# Patient Record
Sex: Female | Born: 1970 | Race: Black or African American | Hispanic: No | Smoking: Never smoker
Health system: Southern US, Community
[De-identification: ages and names within clinical notes are randomized; demographics above are authoritative.]

## PROBLEM LIST (undated history)

## (undated) DIAGNOSIS — K219 Gastro-esophageal reflux disease without esophagitis: Secondary | ICD-10-CM

## (undated) DIAGNOSIS — I10 Essential (primary) hypertension: Secondary | ICD-10-CM

## (undated) HISTORY — PX: ABDOMINAL SURGERY: SHX537

## (undated) HISTORY — PX: ABDOMINAL HYSTERECTOMY: SHX81

## (undated) HISTORY — PX: LAPAROSCOPIC GASTRIC SLEEVE RESECTION: SHX5895

## (undated) HISTORY — PX: FOOT SURGERY: SHX648

## (undated) HISTORY — PX: OTHER SURGICAL HISTORY: SHX169

## (undated) HISTORY — PX: CHOLECYSTECTOMY: SHX55

---

## 2010-07-07 DIAGNOSIS — I10 Essential (primary) hypertension: Secondary | ICD-10-CM | POA: Insufficient documentation

## 2011-02-26 DIAGNOSIS — F988 Other specified behavioral and emotional disorders with onset usually occurring in childhood and adolescence: Secondary | ICD-10-CM | POA: Insufficient documentation

## 2012-03-09 DIAGNOSIS — E6609 Other obesity due to excess calories: Secondary | ICD-10-CM | POA: Insufficient documentation

## 2012-09-27 DIAGNOSIS — N812 Incomplete uterovaginal prolapse: Secondary | ICD-10-CM | POA: Insufficient documentation

## 2012-12-27 DIAGNOSIS — N3946 Mixed incontinence: Secondary | ICD-10-CM | POA: Insufficient documentation

## 2015-03-26 DIAGNOSIS — Z9884 Bariatric surgery status: Secondary | ICD-10-CM | POA: Insufficient documentation

## 2015-04-07 DIAGNOSIS — E559 Vitamin D deficiency, unspecified: Secondary | ICD-10-CM | POA: Insufficient documentation

## 2015-07-08 DIAGNOSIS — M25561 Pain in right knee: Secondary | ICD-10-CM | POA: Insufficient documentation

## 2015-07-08 DIAGNOSIS — M7671 Peroneal tendinitis, right leg: Secondary | ICD-10-CM | POA: Insufficient documentation

## 2015-07-08 DIAGNOSIS — M2141 Flat foot [pes planus] (acquired), right foot: Secondary | ICD-10-CM | POA: Insufficient documentation

## 2015-08-11 DIAGNOSIS — Z0000 Encounter for general adult medical examination without abnormal findings: Secondary | ICD-10-CM | POA: Insufficient documentation

## 2016-02-06 DIAGNOSIS — M7581 Other shoulder lesions, right shoulder: Secondary | ICD-10-CM | POA: Insufficient documentation

## 2016-03-09 ENCOUNTER — Emergency Department: Payer: BLUE CROSS/BLUE SHIELD

## 2016-03-09 ENCOUNTER — Emergency Department
Admission: EM | Admit: 2016-03-09 | Discharge: 2016-03-09 | Disposition: A | Discharge status: Home / Self Care | Payer: BLUE CROSS/BLUE SHIELD | Attending: Emergency Medicine | Admitting: Emergency Medicine

## 2016-03-09 ENCOUNTER — Encounter: Payer: Self-pay | Admitting: Emergency Medicine

## 2016-03-09 DIAGNOSIS — S46001D Unspecified injury of muscle(s) and tendon(s) of the rotator cuff of right shoulder, subsequent encounter: Secondary | ICD-10-CM | POA: Insufficient documentation

## 2016-03-09 DIAGNOSIS — X58XXXD Exposure to other specified factors, subsequent encounter: Secondary | ICD-10-CM | POA: Diagnosis not present

## 2016-03-09 DIAGNOSIS — S46001A Unspecified injury of muscle(s) and tendon(s) of the rotator cuff of right shoulder, initial encounter: Secondary | ICD-10-CM

## 2016-03-09 DIAGNOSIS — M25531 Pain in right wrist: Secondary | ICD-10-CM | POA: Diagnosis present

## 2016-03-09 MED ORDER — LIDOCAINE 5 % EX PTCH: 1.0000 | MEDICATED_PATCH | CUTANEOUS | 0 refills | Status: DC

## 2016-03-09 MED ORDER — LIDOCAINE 5 % EX PTCH
1.0000 | MEDICATED_PATCH | CUTANEOUS | Status: DC
  Administered 2016-03-09: 1 via TRANSDERMAL
  Filled 2016-03-09: qty 1

## 2016-03-09 NOTE — ED Triage Notes (Signed)
Pt states right shoulder and right wrist pain for over a mth.

## 2016-03-09 NOTE — ED Provider Notes (Signed)
[facility] Emergency Department Provider Note  ____________________________________________  Time seen: Approximately 3:26 PM  I have reviewed the triage vital signs and the nursing notes.   HISTORY  Chief Complaint Wrist Pain and Shoulder Pain    HPI Marie Contreras is a 46 y.o. female that presents with right shoulder pain and right wrist pain for greater than 1 month. Patient states that she has difficulty moving arm in any direction. Patient has seen primary care provider for this and was told that she has a rotator cuff injury. Patient was given exercises for injury but exercises were not helping.She states that she has pain in her wrist at night. Patient states that her wrist feels weak. Patient states she only has the symptoms at night. No numbness or tingling.   History reviewed. No pertinent past medical history.  There are no active problems to display for this patient.   Past Surgical History:  Procedure Laterality Date  . ABDOMINAL SURGERY      Prior to Admission medications   Medication Sig Start Date End Date Taking? Authorizing Provider  lidocaine (LIDODERM) 5 % Place 1 patch onto the skin daily. Remove & Discard patch within 12 hours or as directed by MD 03/09/16 03/09/17  Enid DerryAshley Efrat Zuidema, PA-C    Allergies Ibuprofen and Tylenol [acetaminophen]  No family history on file.  Social History Social History  Substance Use Topics  . Smoking status: Never Smoker  . Smokeless tobacco: Never Used  . Alcohol use Yes     Review of Systems  Constitutional: No fever/chills ENT: No upper respiratory complaints. Cardiovascular: No chest pain. Respiratory: No cough. No SOB. Gastrointestinal: No abdominal pain.  No nausea, no vomiting. Skin: Negative for rash, abrasions, lacerations, ecchymosis. Neurological: Negative for headaches, numbness or tingling   ____________________________________________   PHYSICAL EXAM:  VITAL SIGNS: ED  Triage Vitals  Enc Vitals Group     BP 03/09/16 1140 (!) 167/75     Pulse Rate 03/09/16 1140 (!) 51     Resp 03/09/16 1140 18     Temp 03/09/16 1140 98.2 F (36.8 C)     Temp Source 03/09/16 1140 Oral     SpO2 03/09/16 1140 100 %     Weight 03/09/16 1140 186 lb (84.4 kg)     Height 03/09/16 1140 5\' 4"  (1.626 m)     Head Circumference --      Peak Flow --      Pain Score 03/09/16 1141 8     Pain Loc --      Pain Edu? --      Excl. in GC? --      Constitutional: Alert and oriented. Well appearing and in no acute distress. Eyes: Conjunctivae are normal. PERRL. EOMI. Head: Atraumatic. ENT:      Ears:      Nose: No congestion/rhinnorhea.      Mouth/Throat: Mucous membranes are moist.  Neck: No stridor.  No cervical spine tenderness to palpation. Hematological/Lymphatic/Immunilogical: No cervical lymphadenopathy. Cardiovascular: Normal rate, regular rhythm. Good peripheral circulation. 2+ radial pulses. Respiratory: Normal respiratory effort without tachypnea or retractions. Lungs CTAB. Good air entry to the bases with no decreased or absent breath sounds. Musculoskeletal: Pain with abduction and extension of shoulder. Full passive range of motion of shoulder. No tenderness to palpation over wrist or shoulder. Full range of motion of wrist. Strength 5 out of 5 in elbow and fingers. Decreased strength in shoulder. Shoulder exam limited due to pain. Negative Tinel's, Phalens.  Neurologic:  Normal speech and language. No gross focal neurologic deficits are appreciated.  Sensation intact. Skin:  Skin is warm, dry and intact. No rash noted. Psychiatric: Mood and affect are normal. Speech and behavior are normal. Patient exhibits appropriate insight and judgement.   ____________________________________________   LABS (all labs ordered are listed, but only abnormal results are displayed)  Labs Reviewed - No data to  display ____________________________________________  EKG   ____________________________________________  RADIOLOGY Lexine Baton, personally viewed and evaluated these images (plain radiographs) as part of my medical decision making, as well as reviewing the written report by the radiologist.  Dg Shoulder Right  Result Date: 03/09/2016 CLINICAL DATA:  Right shoulder and wrist pain for greater than 1 month. No known injury. EXAM: RIGHT SHOULDER - 2+ VIEW COMPARISON:  None. FINDINGS: Ossicles along the lateral acromion which are perpendicular to the normal acromion physes. No subacromial narrowing. No fracture deformity, rotator cuff calcification, or degenerative joint narrowing. IMPRESSION: No acute finding or degenerative narrowing. Electronically Signed   By: Marnee Spring M.D.   On: 03/09/2016 13:19   Dg Wrist Complete Right  Result Date: 03/09/2016 CLINICAL DATA:  46 year old female with a history of right shoulder and wrist pain for 1 month EXAM: RIGHT WRIST - COMPLETE 3+ VIEW COMPARISON:  None. FINDINGS: No acute fracture identified. No focal soft tissue swelling. No radiopaque foreign body. Carpal bones maintain alignment.  Unremarkable scaphoid view. IMPRESSION: No acute bony abnormality. Signed, Yvone Neu. Loreta Ave, DO [facility] Electronically Signed   By: Gilmer Mor D.O.   On: 03/09/2016 13:14    ____________________________________________    PROCEDURES  Procedure(s) performed:    Procedures    Medications  lidocaine (LIDODERM) 5 % 1 patch (1 patch Transdermal Patch Applied 03/09/16 1233)     ____________________________________________   INITIAL IMPRESSION / ASSESSMENT AND PLAN / ED COURSE  Pertinent labs & imaging results that were available during my care of the patient were reviewed by me and considered in my medical decision making (see chart for details).  Review of the [place] CSRS was performed  in accordance of the [facility] prior to dispensing any controlled drugs.     Patient's diagnosis is consistent with Rotator cuff injury. Vital signs and exam are reassuring. X-ray negative for any acute bony abnormalities seen on wrist or elbow. Patient will be discharged home with prescriptions for lidocaine patch. Patient is to follow up with ortho as directed. Patient is given ED precautions to return to the ED for any worsening or new symptoms.     ____________________________________________  FINAL CLINICAL IMPRESSION(S) / ED DIAGNOSES  Final diagnoses:  Injury of right rotator cuff, initial encounter      NEW MEDICATIONS STARTED DURING THIS VISIT:  Discharge Medication List as of 03/09/2016  1:40 PM    START taking these medications   Details  lidocaine (LIDODERM) 5 % Place 1 patch onto the skin daily. Remove & Discard patch within 12 hours or as directed by MD, Starting Tue 03/09/2016, Until Wed 03/09/2017, Print            This chart was dictated using voice recognition software/Dragon. Despite best efforts to proofread, errors can occur which can change the meaning. Any change was purely unintentional.    Enid Derry, PA-C 03/09/16 1545    Phineas Semen, MD 03/10/16 1406

## 2016-03-09 NOTE — ED Notes (Signed)
Pt presents with right shoulder pain x 1 month. She went to PCP and was given exercises to do but it is getting worse instead of better. Pt alert & oriented. NAD noted.

## 2017-01-29 ENCOUNTER — Emergency Department: Payer: BLUE CROSS/BLUE SHIELD

## 2017-01-29 ENCOUNTER — Emergency Department
Admission: EM | Admit: 2017-01-29 | Discharge: 2017-01-29 | Disposition: A | Discharge status: Home / Self Care | Payer: BLUE CROSS/BLUE SHIELD | Attending: Emergency Medicine | Admitting: Emergency Medicine

## 2017-01-29 ENCOUNTER — Encounter: Payer: Self-pay | Admitting: Emergency Medicine

## 2017-01-29 DIAGNOSIS — Z79899 Other long term (current) drug therapy: Secondary | ICD-10-CM | POA: Insufficient documentation

## 2017-01-29 DIAGNOSIS — Y939 Activity, unspecified: Secondary | ICD-10-CM | POA: Diagnosis not present

## 2017-01-29 DIAGNOSIS — M7918 Myalgia, other site: Secondary | ICD-10-CM | POA: Diagnosis not present

## 2017-01-29 DIAGNOSIS — Y999 Unspecified external cause status: Secondary | ICD-10-CM | POA: Insufficient documentation

## 2017-01-29 MED ORDER — IBUPROFEN 600 MG PO TABS: 600.0000 mg | ORAL_TABLET | Freq: Once | ORAL | Status: DC

## 2017-01-29 MED ORDER — HYDROCODONE-ACETAMINOPHEN 5-325 MG PO TABS
1.0000 | ORAL_TABLET | Freq: Once | ORAL | Status: AC
  Administered 2017-01-29: 1 via ORAL
  Filled 2017-01-29: qty 1

## 2017-01-29 MED ORDER — IBUPROFEN 600 MG PO TABS: 600.0000 mg | ORAL_TABLET | Freq: Three times a day (TID) | ORAL | 0 refills | Status: DC | PRN
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
## 2017-01-29 NOTE — ED Notes (Signed)
Reviewed discharge instructions and follow-up care with patient. Patient verbalized understanding of all information reviewed. Patient stable, with no distress noted at this time.    Patient refused ibuprofen prescription due to hx of gastric bypass surgery.  MD informed. MD reported patient could take OTC tylenol instead. Patient informed. Patient verbalized understanding.
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
## 2017-01-29 NOTE — ED Notes (Signed)
Patient returned from CT

## 2017-02-09 ENCOUNTER — Ambulatory Visit: Payer: BLUE CROSS/BLUE SHIELD | Admitting: Family Medicine

## 2017-02-22 ENCOUNTER — Ambulatory Visit: Payer: BLUE CROSS/BLUE SHIELD | Admitting: Family Medicine

## 2017-03-09 ENCOUNTER — Encounter: Payer: Self-pay | Admitting: Family Medicine

## 2017-03-09 ENCOUNTER — Ambulatory Visit: Payer: BLUE CROSS/BLUE SHIELD | Admitting: Family Medicine

## 2017-03-09 VITALS — BP 130/80 | HR 59 | Ht 64.0 in | Wt 210.5 lb

## 2017-03-09 DIAGNOSIS — I10 Essential (primary) hypertension: Secondary | ICD-10-CM | POA: Diagnosis not present
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
## 2017-04-11 ENCOUNTER — Ambulatory Visit: Payer: BLUE CROSS/BLUE SHIELD | Admitting: Family Medicine

## 2017-04-11 DIAGNOSIS — Z0289 Encounter for other administrative examinations: Secondary | ICD-10-CM

## 2017-07-28 ENCOUNTER — Encounter: Payer: Self-pay | Admitting: Family Medicine

## 2017-07-28 ENCOUNTER — Ambulatory Visit (INDEPENDENT_AMBULATORY_CARE_PROVIDER_SITE_OTHER): Payer: BLUE CROSS/BLUE SHIELD | Admitting: Family Medicine

## 2017-07-28 VITALS — BP 138/80 | HR 53 | Temp 97.9°F | Ht 64.0 in | Wt 213.0 lb

## 2017-07-28 DIAGNOSIS — R001 Bradycardia, unspecified: Secondary | ICD-10-CM | POA: Insufficient documentation

## 2017-07-28 DIAGNOSIS — Z01818 Encounter for other preprocedural examination: Secondary | ICD-10-CM
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
## 2017-07-28 NOTE — Addendum Note (Signed)
Addended by: Varney BilesWIESNER, Kathy Wahid M on: 07/28/2017 02:52 PM   Modules accepted: Orders

## 2017-08-02 ENCOUNTER — Other Ambulatory Visit (INDEPENDENT_AMBULATORY_CARE_PROVIDER_SITE_OTHER): Payer: BLUE CROSS/BLUE SHIELD

## 2017-08-02 DIAGNOSIS — Z01818 Encounter for other preprocedural examination: Secondary | ICD-10-CM | POA: Diagnosis not present

## 2017-08-02 DIAGNOSIS — R001 Bradycardia, unspecified: Secondary | ICD-10-CM | POA: Diagnosis not present

## 2017-08-02 LAB — BASIC METABOLIC PANEL
BUN: 14 mg/dL (ref 6–23)
CALCIUM: 10.1 mg/dL (ref 8.4–10.5)
CO2: 25 meq/L (ref 19–32)
CREATININE: 0.91 mg/dL (ref 0.40–1.20)
Chloride: 102 mEq/L (ref 96–112)
GFR: 85.12 mL/min (ref >60.00)
Glucose, Bld: 96 mg/dL (ref 70–99)
Potassium: 4.2 mEq/L (ref 3.5–5.1)
SODIUM: 137 meq/L (ref 135–145)

## 2017-08-02 LAB — CBC WITH DIFFERENTIAL/PLATELET
Basophils Absolute: 0 10*3/uL (ref 0.0–0.1)
Basophils Relative: 0.3 % (ref 0.0–3.0)
EOS PCT: 1.7 % (ref 0.0–5.0)
Eosinophils Absolute: 0.1 10*3/uL (ref 0.0–0.7)
HCT: 45.1 % (ref 36.0–46.0)
Hemoglobin: 15.2 g/dL — ABNORMAL HIGH (ref 12.0–15.0)
LYMPHS ABS: 2.9 10*3/uL (ref 0.7–4.0)
Lymphocytes Relative: 41.3 % (ref 12.0–46.0)
MCHC: 33.6 g/dL (ref 30.0–36.0)
MCV: 88.2 fl (ref 78.0–100.0)
MONO ABS: 0.3 10*3/uL (ref 0.1–1.0)
MONOS PCT: 4.7 % (ref 3.0–12.0)
NEUTROS ABS: 3.7 10*3/uL (ref 1.4–7.7)
NEUTROS PCT: 52 % (ref 43.0–77.0)
PLATELETS: 228 10*3/uL (ref 150.0–400.0)
RBC: 5.12 Mil/uL — AB (ref 3.87–5.11)
RDW: 13.7 % (ref 11.5–15.5)
WBC: 7.1 10*3/uL (ref 4.0–10.5)

## 2017-08-02 LAB — TSH: TSH: 1.97 u[IU]/mL (ref 0.35–4.50)

## 2017-09-26 ENCOUNTER — Encounter: Payer: BLUE CROSS/BLUE SHIELD | Admitting: Family Medicine

## 2017-12-22 ENCOUNTER — Encounter (HOSPITAL_COMMUNITY): Payer: Self-pay | Admitting: Emergency Medicine

## 2017-12-22 ENCOUNTER — Emergency Department (HOSPITAL_COMMUNITY): Payer: BLUE CROSS/BLUE SHIELD

## 2017-12-22 ENCOUNTER — Emergency Department (HOSPITAL_COMMUNITY)
Admission: EM | Admit: 2017-12-22 | Discharge: 2017-12-22 | Disposition: A | Discharge status: Home / Self Care | Payer: BLUE CROSS/BLUE SHIELD | Attending: Emergency Medicine | Admitting: Emergency Medicine

## 2017-12-22 ENCOUNTER — Other Ambulatory Visit: Payer: Self-pay

## 2017-12-22 DIAGNOSIS — E876 Hypokalemia: Secondary | ICD-10-CM

## 2017-12-22 DIAGNOSIS — Z79899 Other long term (current) drug therapy: Secondary | ICD-10-CM | POA: Diagnosis not present

## 2017-12-22 DIAGNOSIS — R0789 Other chest pain: Secondary | ICD-10-CM | POA: Diagnosis not present

## 2017-12-22 DIAGNOSIS — I10 Essential (primary) hypertension: Secondary | ICD-10-CM | POA: Insufficient documentation

## 2017-12-22 DIAGNOSIS — R079 Chest pain, unspecified: Secondary | ICD-10-CM | POA: Diagnosis present

## 2017-12-22 LAB — CBC
HEMATOCRIT: 44.8 % (ref 36.0–46.0)
HEMOGLOBIN: 14 g/dL (ref 12.0–15.0)
MCH: 26.9 pg (ref 26.0–34.0)
MCHC: 31.3 g/dL (ref 30.0–36.0)
MCV: 86.2 fL (ref 80.0–100.0)
NRBC: 0 % (ref 0.0–0.2)
Platelets: 294 10*3/uL (ref 150–400)
RBC: 5.2 MIL/uL — AB (ref 3.87–5.11)
RDW: 14.5 % (ref 11.5–15.5)
WBC: 6.6 10*3/uL (ref 4.0–10.5)

## 2017-12-22 LAB — BASIC METABOLIC PANEL
ANION GAP: 10 (ref 5–15)
BUN: 15 mg/dL (ref 6–20)
CALCIUM: 9.1 mg/dL (ref 8.9–10.3)
CO2: 26 mmol/L (ref 22–32)
CREATININE: 0.87 mg/dL (ref 0.44–1.00)
Chloride: 104 mmol/L (ref 98–111)
Glucose, Bld: 87 mg/dL (ref 70–99)
Potassium: 3.3 mmol/L — ABNORMAL LOW (ref 3.5–5.1)
SODIUM: 140 mmol/L (ref 135–145)

## 2017-12-22 LAB — I-STAT BETA HCG BLOOD, ED (MC, WL, AP ONLY)

## 2017-12-22 LAB — I-STAT TROPONIN, ED: Troponin i, poc: 0 ng/mL (ref 0.00–0.08)

## 2017-12-22 MED ORDER — POTASSIUM CHLORIDE CRYS ER 20 MEQ PO TBCR
20.0000 meq | EXTENDED_RELEASE_TABLET | Freq: Once | ORAL | Status: AC
  Administered 2017-12-22: 20 meq via ORAL
  Filled 2017-12-22: qty 1

## 2017-12-22 MED ORDER — ACETAMINOPHEN 325 MG PO TABS
650.0000 mg | ORAL_TABLET | Freq: Once | ORAL | Status: AC
  Administered 2017-12-22: 650 mg via ORAL
  Filled 2017-12-22: qty 2

## 2017-12-22 NOTE — ED Triage Notes (Signed)
Patient complaining of mid chest pain and left shoulder. This started yesterday. Patient states the symptoms are getting worse. Patient states her blood pressure has been high.

## 2017-12-22 NOTE — ED Provider Notes (Signed)
[facility]-EMERGENCY DEPT Provider Note   CSN: [id number] Arrival date & time: 12/22/17  0645     History   Chief Complaint Chief Complaint  Patient presents with  . Chest Pain    HPI Marie Contreras is a 47 y.o. female.  The history is provided by the patient.  Chest Pain   This is a new problem. Episode onset: 3 days ago. Episode frequency: intially pain was intermittent but has been constant since yesterday. The problem has been gradually worsening. Associated with: worse with movement, breathing, certain positions. Pain location: central chest. The pain is at a severity of 5/10. The pain is moderate. The quality of the pain is described as heavy and pleuritic. The pain does not radiate (pt states left shoulder has been hurting but this was going on long before chest pain and thinks she has a torn rotator cuff). Duration of episode(s) is 3 days. The symptoms are aggravated by certain positions and deep breathing. Associated symptoms include cough and malaise/fatigue. Pertinent negatives include no abdominal pain, no exertional chest pressure, no fever, no leg pain, no lower extremity edema, no palpitations, no sputum production, no vomiting and no weakness. Associated symptoms comments: occassional SOB and 1 episode of nausea but not constant. She has tried rest for the symptoms. The treatment provided no relief. Risk factors include obesity (No oral contraceptive use, recent surgeries, prolonged immobilization.  No family history of cardiac disease or clotting disorder). Past medical history comments: Status post gastric bypass.  Also patient recently started back on her blood pressure medication 3 days ago  Pertinent negatives for family medical history include: no CAD and no PE. Past workup comments: no known heart disease.    History reviewed. No pertinent past medical history.  Patient Active Problem List   Diagnosis Date Noted  . Preoperative clearance  07/28/2017  . Bradycardia 07/28/2017  . Essential hypertension 03/09/2017    Past Surgical History:  Procedure Laterality Date  . ABDOMINAL SURGERY       OB History   None      Home Medications    Prior to Admission medications   Medication Sig Start Date End Date Taking? Authorizing Provider  amLODipine (NORVASC) 5 MG tablet Take 5 mg by mouth daily.    [provider]  hydrochlorothiazide (HYDRODIURIL) 25 MG tablet Take 25 mg by mouth daily.    [provider]    Family History History reviewed. No pertinent family history.  Social History Social History   Tobacco Use  . Smoking status: Never Smoker  . Smokeless tobacco: Never Used  Substance Use Topics  . Alcohol use: Yes  . Drug use: Never     Allergies   Acetaminophen; Aspirin; Ibuprofen; Tramadol; Codeine; Other; and Tape   Review of Systems Review of Systems  Constitutional: Positive for malaise/fatigue. Negative for fever.  Respiratory: Positive for cough. Negative for sputum production.   Cardiovascular: Positive for chest pain. Negative for palpitations.  Gastrointestinal: Negative for abdominal pain and vomiting.  Neurological: Negative for weakness.  All other systems reviewed and are negative.    Physical Exam Updated Vital Signs BP (!) 153/91 (BP Location: Left Arm)   Pulse (!) 56   Temp 98.2 F (36.8 C) (Oral)   Resp 18   Ht 5\' 4"  (1.626 m)   Wt 95.3 kg   BMI 36.05 kg/m   Physical Exam  Constitutional: She is oriented to person, place, and time. She appears well-developed and well-nourished. No  distress.  HENT:  Head: Normocephalic and atraumatic.  Mouth/Throat: Oropharynx is clear and moist.  Eyes: Pupils are equal, round, and reactive to light. Conjunctivae and EOM are normal.  Neck: Normal range of motion. Neck supple.  Cardiovascular: Normal rate, regular rhythm and intact distal pulses.  No murmur heard. Pulmonary/Chest: Effort normal and breath sounds  normal. No respiratory distress. She has no wheezes. She has no rales. She exhibits tenderness.  Diffuse central chest discomfort with palpation  Abdominal: Soft. She exhibits no distension. There is no tenderness. There is no rebound and no guarding.  Musculoskeletal: Normal range of motion. She exhibits no edema or tenderness.  Neurological: She is alert and oriented to person, place, and time.  Skin: Skin is warm and dry. No rash noted. No erythema.  Psychiatric: She has a normal mood and affect. Her behavior is normal.  Nursing note and vitals reviewed.    ED Treatments / Results  Labs (all labs ordered are listed, but only abnormal results are displayed) Labs Reviewed  CBC - Abnormal; Notable for the following components:      Result Value   RBC 5.20 (*)    All other components within normal limits  BASIC METABOLIC PANEL - Abnormal; Notable for the following components:   Potassium 3.3 (*)    All other components within normal limits  I-STAT TROPONIN, ED  I-STAT BETA HCG BLOOD, ED ([facility], [facility], [facility] ONLY)    EKG EKG Interpretation  Date/Time:  Thursday December 22 2017 06:54:33 EST Ventricular Rate:  55 PR Interval:    QRS Duration: 89 QT Interval:  436 QTC Calculation: 417 R Axis:   -34 Text Interpretation:  Sinus rhythm Probable left atrial enlargement Left axis deviation No previous tracing Confirmed by Gwyneth Sprout ([id number]) on 12/22/2017 7:20:22 AM   Radiology Dg Chest 2 View  Result Date: 12/22/2017 CLINICAL DATA:  Mid chest pain for 2 days, hypertension EXAM: CHEST - 2 VIEW COMPARISON:  01/29/2017 FINDINGS: Upper normal heart size. Mediastinal contours and pulmonary vascularity normal. Lungs clear. No pulmonary infiltrate, pleural effusion or pneumothorax. Surgical clips RIGHT upper quadrant question prior cholecystectomy. No acute osseous findings. IMPRESSION: No acute abnormalities. Electronically Signed   By: Ulyses Southward M.D.   On: 12/22/2017 07:41     Procedures Procedures (including critical care time)  Medications Ordered in ED Medications - No data to display   Initial Impression / Assessment and Plan / ED Course  I have reviewed the triage vital signs and the nursing notes.  Pertinent labs & imaging results that were available during my care of the patient were reviewed by me and considered in my medical decision making (see chart for details).     Pt with atypical story for CP that started 3 days ago with a dry cough and fatigue.  Heart score of 1 and low risk.  PERC neg. Pt is not a smoker or drug user.  Lower concern for ACS, dissection, PE, or PNA.  No evidence of shingles on exam and seems less likely to be GI related.  Pt did recently start back on HCTZ and amlodipine 3 days ago which is when her sx started.  Possible hypokalemia, vs infectious and chest wall pain. EKG with significant ST changes.  CXR wnl.  CBC, BMP, trop pending.  10:03 AM Labs are within normal limits except for mild hypokalemia of 3.3 and patient was replaced orally.  This is most likely from hydrochlorothiazide that she is recently started.  Troponin was  negative.  Feel that patient's pain is more in the chest wall and could be related to viral infection as she is also having a cough.  She was given Tylenol for the pain.  Instructed to drink plenty of fluids and rest.  Also instructed to eat foods with higher potassium content because she is taking hydrochlorothiazide.  Patient given strict return precautions.   Final Clinical Impressions(s) / ED Diagnoses   Final diagnoses:  Chest wall pain  Hypokalemia    ED Discharge Orders    None       Gwyneth Sprout, MD 12/22/17 1004

## 2017-12-22 NOTE — ED Notes (Signed)
Pt dressed into gown and placed on cardiac monitoring.  

## 2018-01-03 ENCOUNTER — Encounter: Payer: BLUE CROSS/BLUE SHIELD | Admitting: Family Medicine

## 2018-01-04 ENCOUNTER — Other Ambulatory Visit: Payer: Self-pay | Admitting: Orthopedic Surgery

## 2018-01-04 DIAGNOSIS — M67912 Unspecified disorder of synovium and tendon, left shoulder: Secondary | ICD-10-CM

## 2018-01-11 ENCOUNTER — Ambulatory Visit
Admission: RE | Admit: 2018-01-11 | Discharge: 2018-01-11 | Disposition: A | Discharge status: Home / Self Care | Payer: BLUE CROSS/BLUE SHIELD | Source: Ambulatory Visit | Attending: Orthopedic Surgery | Admitting: Orthopedic Surgery

## 2018-01-11 DIAGNOSIS — M67912 Unspecified disorder of synovium and tendon, left shoulder: Secondary | ICD-10-CM

## 2018-01-16 ENCOUNTER — Ambulatory Visit (INDEPENDENT_AMBULATORY_CARE_PROVIDER_SITE_OTHER): Payer: BLUE CROSS/BLUE SHIELD | Admitting: Family Medicine

## 2018-01-16 ENCOUNTER — Encounter: Payer: Self-pay | Admitting: Family Medicine

## 2018-01-16 VITALS — BP 120/70 | HR 61 | Ht 64.0 in | Wt 214.1 lb

## 2018-01-16 DIAGNOSIS — I10 Essential (primary) hypertension: Secondary | ICD-10-CM

## 2018-01-16 DIAGNOSIS — E6609 Other obesity due to excess calories: Secondary | ICD-10-CM | POA: Diagnosis not present

## 2018-01-16 DIAGNOSIS — Z0000 Encounter for general adult medical examination without abnormal findings: Secondary | ICD-10-CM | POA: Diagnosis not present

## 2018-01-16 DIAGNOSIS — Z6835 Body mass index (BMI) 35.0-35.9, adult: Secondary | ICD-10-CM | POA: Diagnosis not present

## 2018-01-16 DIAGNOSIS — E66812 Obesity, class 2: Secondary | ICD-10-CM

## 2018-01-16 NOTE — Patient Instructions (Addendum)
Health Maintenance, Female Adopting a healthy lifestyle and getting preventive care can go a long way to promote health and wellness. Talk with your health care provider about what schedule of regular examinations is right for you. This is a good chance for you to check in with your provider about disease prevention and staying healthy. In between checkups, there are plenty of things you can do on your own. Experts have done a lot of research about which lifestyle changes and preventive measures are most likely to keep you healthy. Ask your health care provider for more information. Weight and diet Eat a healthy diet  Be sure to include plenty of vegetables, fruits, low-fat dairy products, and lean protein.  Do not eat a lot of foods high in solid fats, added sugars, or salt.  Get regular exercise. This is one of the most important things you can do for your health. ? Most adults should exercise for at least 150 minutes each week. The exercise should increase your heart rate and make you sweat (moderate-intensity exercise). ? Most adults should also do strengthening exercises at least twice a week. This is in addition to the moderate-intensity exercise.  Maintain a healthy weight  Body mass index (BMI) is a measurement that can be used to identify possible weight problems. It estimates body fat based on height and weight. Your health care provider can help determine your BMI and help you achieve or maintain a healthy weight.  For females 20 years of age and older: ? A BMI below 18.5 is considered underweight. ? A BMI of 18.5 to 24.9 is normal. ? A BMI of 25 to 29.9 is considered overweight. ? A BMI of 30 and above is considered obese.  Watch levels of cholesterol and blood lipids  You should start having your blood tested for lipids and cholesterol at 47 years of age, then have this test every 5 years.  You may need to have your cholesterol levels checked more often if: ? Your lipid or  cholesterol levels are high. ? You are older than 47 years of age. ? You are at high risk for heart disease.  Cancer screening Lung Cancer  Lung cancer screening is recommended for adults 55-80 years old who are at high risk for lung cancer because of a history of smoking.  A yearly low-dose CT scan of the lungs is recommended for people who: ? Currently smoke. ? Have quit within the past 15 years. ? Have at least a 30-pack-year history of smoking. A pack year is smoking an average of one pack of cigarettes a day for 1 year.  Yearly screening should continue until it has been 15 years since you quit.  Yearly screening should stop if you develop a health problem that would prevent you from having lung cancer treatment.  Breast Cancer  Practice breast self-awareness. This means understanding how your breasts normally appear and feel.  It also means doing regular breast self-exams. Let your health care provider know about any changes, no matter how small.  If you are in your 20s or 30s, you should have a clinical breast exam (CBE) by a health care provider every 1-3 years as part of a regular health exam.  If you are 40 or older, have a CBE every year. Also consider having a breast X-ray (mammogram) every year.  If you have a family history of breast cancer, talk to your health care provider about genetic screening.  If you are at high risk   for breast cancer, talk to your health care provider about having an MRI and a mammogram every year.  Breast cancer gene (BRCA) assessment is recommended for women who have family members with BRCA-related cancers. BRCA-related cancers include: ? Breast. ? Ovarian. ? Tubal. ? Peritoneal cancers.  Results of the assessment will determine the need for genetic counseling and BRCA1 and BRCA2 testing.  Cervical Cancer Your health care provider may recommend that you be screened regularly for cancer of the pelvic organs (ovaries, uterus, and  vagina). This screening involves a pelvic examination, including checking for microscopic changes to the surface of your cervix (Pap test). You may be encouraged to have this screening done every 3 years, beginning at age 22.  For women ages 56-65, health care providers may recommend pelvic exams and Pap testing every 3 years, or they may recommend the Pap and pelvic exam, combined with testing for human papilloma virus (HPV), every 5 years. Some types of HPV increase your risk of cervical cancer. Testing for HPV may also be done on women of any age with unclear Pap test results.  Other health care providers may not recommend any screening for nonpregnant women who are considered low risk for pelvic cancer and who do not have symptoms. Ask your health care provider if a screening pelvic exam is right for you.  If you have had past treatment for cervical cancer or a condition that could lead to cancer, you need Pap tests and screening for cancer for at least 20 years after your treatment. If Pap tests have been discontinued, your risk factors (such as having a new sexual partner) need to be reassessed to determine if screening should resume. Some women have medical problems that increase the chance of getting cervical cancer. In these cases, your health care provider may recommend more frequent screening and Pap tests.  Colorectal Cancer  This type of cancer can be detected and often prevented.  Routine colorectal cancer screening usually begins at 47 years of age and continues through 47 years of age.  Your health care provider may recommend screening at an earlier age if you have risk factors for colon cancer.  Your health care provider may also recommend using home test kits to check for hidden blood in the stool.  A small camera at the end of a tube can be used to examine your colon directly (sigmoidoscopy or colonoscopy). This is done to check for the earliest forms of colorectal  cancer.  Routine screening usually begins at age 33.  Direct examination of the colon should be repeated every 5-10 years through 47 years of age. However, you may need to be screened more often if early forms of precancerous polyps or small growths are found.  Skin Cancer  Check your skin from head to toe regularly.  Tell your health care provider about any new moles or changes in moles, especially if there is a change in a mole's shape or color.  Also tell your health care provider if you have a mole that is larger than the size of a pencil eraser.  Always use sunscreen. Apply sunscreen liberally and repeatedly throughout the day.  Protect yourself by wearing long sleeves, pants, a wide-brimmed hat, and sunglasses whenever you are outside.  Heart disease, diabetes, and high blood pressure  High blood pressure causes heart disease and increases the risk of stroke. High blood pressure is more likely to develop in: ? People who have blood pressure in the high end of  the normal range (130-139/85-89 mm Hg). ? People who are overweight or obese. ? People who are African American.  If you are 21-29 years of age, have your blood pressure checked every 3-5 years. If you are 3 years of age or older, have your blood pressure checked every year. You should have your blood pressure measured twice-once when you are at a hospital or clinic, and once when you are not at a hospital or clinic. Record the average of the two measurements. To check your blood pressure when you are not at a hospital or clinic, you can use: ? An automated blood pressure machine at a pharmacy. ? A home blood pressure monitor.  If you are between 17 years and 37 years old, ask your health care provider if you should take aspirin to prevent strokes.  Have regular diabetes screenings. This involves taking a blood sample to check your fasting blood sugar level. ? If you are at a normal weight and have a low risk for diabetes,  have this test once every three years after 47 years of age. ? If you are overweight and have a high risk for diabetes, consider being tested at a younger age or more often. Preventing infection Hepatitis B  If you have a higher risk for hepatitis B, you should be screened for this virus. You are considered at high risk for hepatitis B if: ? You were born in a country where hepatitis B is common. Ask your health care provider which countries are considered high risk. ? Your parents were born in a high-risk country, and you have not been immunized against hepatitis B (hepatitis B vaccine). ? You have HIV or AIDS. ? You use needles to inject street drugs. ? You live with someone who has hepatitis B. ? You have had sex with someone who has hepatitis B. ? You get hemodialysis treatment. ? You take certain medicines for conditions, including cancer, organ transplantation, and autoimmune conditions.  Hepatitis C  Blood testing is recommended for: ? Everyone born from [birth year] through 1965. ? Anyone with known risk factors for hepatitis C.  Sexually transmitted infections (STIs)  You should be screened for sexually transmitted infections (STIs) including gonorrhea and chlamydia if: ? You are sexually active and are younger than 47 years of age. ? You are older than 47 years of age and your health care provider tells you that you are at risk for this type of infection. ? Your sexual activity has changed since you were last screened and you are at an increased risk for chlamydia or gonorrhea. Ask your health care provider if you are at risk.  If you do not have HIV, but are at risk, it may be recommended that you take a prescription medicine daily to prevent HIV infection. This is called pre-exposure prophylaxis (PrEP). You are considered at risk if: ? You are sexually active and do not regularly use condoms or know the HIV status of your partner(s). ? You take drugs by injection. ? You are  sexually active with a partner who has HIV.  Talk with your health care provider about whether you are at high risk of being infected with HIV. If you choose to begin PrEP, you should first be tested for HIV. You should then be tested every 3 months for as long as you are taking PrEP. Pregnancy  If you are premenopausal and you may become pregnant, ask your health care provider about preconception counseling.  If you may become  pregnant, take 400 to 800 micrograms (mcg) of folic acid every day.  If you want to prevent pregnancy, talk to your health care provider about birth control (contraception). Osteoporosis and menopause  Osteoporosis is a disease in which the bones lose minerals and strength with aging. This can result in serious bone fractures. Your risk for osteoporosis can be identified using a bone density scan.  If you are 46 years of age or older, or if you are at risk for osteoporosis and fractures, ask your health care provider if you should be screened.  Ask your health care provider whether you should take a calcium or vitamin D supplement to lower your risk for osteoporosis.  Menopause may have certain physical symptoms and risks.  Hormone replacement therapy may reduce some of these symptoms and risks. Talk to your health care provider about whether hormone replacement therapy is right for you. Follow these instructions at home:  Schedule regular health, dental, and eye exams.  Stay current with your immunizations.  Do not use any tobacco products including cigarettes, chewing tobacco, or electronic cigarettes.  If you are pregnant, do not drink alcohol.  If you are breastfeeding, limit how much and how often you drink alcohol.  Limit alcohol intake to no more than 1 drink per day for nonpregnant women. One drink equals 12 ounces of beer, 5 ounces of wine, or 1 ounces of hard liquor.  Do not use street drugs.  Do not share needles.  Ask your health care  provider for help if you need support or information about quitting drugs.  Tell your health care provider if you often feel depressed.  Tell your health care provider if you have ever been abused or do not feel safe at home. This information is not intended to replace advice given to you by your health care provider. Make sure you discuss any questions you have with your health care provider. Document Released: 08/17/2010 Document Revised: 07/10/2015 Document Reviewed: 11/05/2014 Elsevier Interactive Patient Education  2018 [employer]. BMI for Adults Body mass index (BMI) is a number that is calculated from a person's weight and height. In most adults, the number is used to find how much of an adult's weight is made up of fat. BMI is not as accurate as a direct measure of body fat. How is BMI calculated? BMI is calculated by dividing weight in kilograms by height in meters squared. It can also be calculated by dividing weight in pounds by height in inches squared, then multiplying the resulting number by 703. Charts are available to help you find your BMI quickly and easily without doing this calculation. How is BMI interpreted? Health care professionals use BMI charts to identify whether an adult is underweight, at a normal weight, or overweight based on the following guidelines:  Underweight: BMI less than 18.5.  Normal weight: BMI between 18.5 and 24.9.  Overweight: BMI between 25 and 29.9.  Obese: BMI of 30 and above.  BMI is usually interpreted the same for males and females. Weight includes both fat and muscle, so someone with a muscular build, such as an athlete, may have a BMI that is higher than 24.9. In cases like these, BMI may not accurately depict body fat. To determine if excess body fat is the cause of a BMI of 25 or higher, further assessments may need to be done by a health care provider. Why is BMI a useful tool? BMI is used to identify a possible weight problem that  may be related to a medical problem or may increase the risk for medical problems. BMI can also be used to promote changes to reach a healthy weight. This information is not intended to replace advice given to you by your health care provider. Make sure you discuss any questions you have with your health care provider. Document Released: 10/14/2003 Document Revised: 06/12/2015 Document Reviewed: 06/29/2013 Elsevier Interactive Patient Education  [employer].

## 2018-01-16 NOTE — Progress Notes (Signed)
Established Patient Office Visit  Subjective:  Patient ID: Marie Contreras, female    DOB: [birth year]  Age: 47 y.o. MRN: [id number]  CC:  Chief Complaint  Patient presents with  . Annual Exam    HPI Marie Contreras presents for a complete physical exam. She is taking her medications as prescribed. She declines the flu vaccine and has had a hysterectomy.  Patient is not fasting today.  She was seen in the emergency room last month for chest pain.  Her chest pain has since resolved and has not returned.  She is currently seeing the orthopedic doctor for left shoulder pain.  She works at [employer] on [employer].  She has a history of obesity and is status post placement of a gastric sleeve.  She has lost 70 pounds since this procedure.  Weight loss has plateaued.  She is interested in possibly seeing a clinical nutritionist.  Her parents are in their 7870s.  Mom has elevated cholesterol and dad has diabetes.  Patient lives with her daughter and grandchild.  Patient does not smoke use illicit drugs and rarely drinks alcohol.  She is status post hysterectomy for uterine prolapse.  History reviewed. No pertinent past medical history.  Past Surgical History:  Procedure Laterality Date  . ABDOMINAL SURGERY      History reviewed. No pertinent family history.  Social History   Socioeconomic History  . Marital status: Married    Spouse name: Not on file  . Number of children: Not on file  . Years of education: Not on file  . Highest education level: Not on file  Occupational History  . Not on file  Social Needs  . Financial resource strain: Not on file  . Food insecurity:    Worry: Not on file    Inability: Not on file  . Transportation needs:    Medical: Not on file    Non-medical: Not on file  Tobacco Use  . Smoking status: Never Smoker  . Smokeless tobacco: Never Used  Substance and Sexual Activity  . Alcohol use: Yes    Comment: rarely  . Drug use: Never  . Sexual activity: Not  on file  Lifestyle  . Physical activity:    Days per week: Not on file    Minutes per session: Not on file  . Stress: Not on file  Relationships  . Social connections:    Talks on phone: Not on file    Gets together: Not on file    Attends religious service: Not on file    Active member of club or organization: Not on file    Attends meetings of clubs or organizations: Not on file    Relationship status: Not on file  . Intimate partner violence:    Fear of current or ex partner: Not on file    Emotionally abused: Not on file    Physically abused: Not on file    Forced sexual activity: Not on file  Other Topics Concern  . Not on file  Social History Narrative  . Not on file    Outpatient Medications Prior to Visit  Medication Sig Dispense Refill  . amLODipine (NORVASC) 5 MG tablet Take 5 mg by mouth daily.    . hydrochlorothiazide (HYDRODIURIL) 25 MG tablet Take 25 mg by mouth daily.    Marland Kitchen. omeprazole (PRILOSEC) 20 MG capsule Take 20 mg by mouth daily as needed (heartburn).     No facility-administered medications prior to visit.  Allergies  Allergen Reactions  . Aspirin Anaphylaxis    History of Sleeve Gastrectomy surgery   . Ibuprofen Other (See Comments)    History of Sleeve Gastrectomy surgery   . Tramadol Hives  . Codeine Hives  . Tape Other (See Comments)    Uncoded Allergy. Allergen: NO LONGER A DPC PT!    ROS Review of Systems  Constitutional: Negative for chills, diaphoresis, fatigue, fever and unexpected weight change.  HENT: Negative.   Eyes: Negative for photophobia and visual disturbance.  Respiratory: Negative.   Cardiovascular: Negative.   Gastrointestinal: Negative.   Endocrine: Negative for polyphagia and polyuria.  Musculoskeletal: Positive for arthralgias.  Skin: Negative for pallor.  Allergic/Immunologic: Negative for immunocompromised state.  Neurological: Negative for syncope, speech difficulty and numbness.  Hematological: Does not  bruise/bleed easily.  Psychiatric/Behavioral: Negative.    Depression screen [facility] 2/9 01/16/2018  Decreased Interest 0  Down, Depressed, Hopeless 0  PHQ - 2 Score 0  Altered sleeping 0  Tired, decreased energy 0  Change in appetite 0  Feeling bad or failure about yourself  0  Trouble concentrating 0  Moving slowly or fidgety/restless 0  Suicidal thoughts 0  PHQ-9 Score 0      Objective:    Physical Exam  Constitutional: She is oriented to person, place, and time. She appears well-developed and well-nourished. No distress.  HENT:  Head: Normocephalic and atraumatic.  Right Ear: External ear normal.  Left Ear: External ear normal.  Mouth/Throat: Oropharynx is clear and moist. No oropharyngeal exudate.  Eyes: Pupils are equal, round, and reactive to light. Conjunctivae are normal. Right eye exhibits no discharge. Left eye exhibits no discharge. No scleral icterus.  Neck: Neck supple. No JVD present. No tracheal deviation present. No thyromegaly present.  Cardiovascular: Normal rate, regular rhythm and normal heart sounds.  Pulmonary/Chest: Effort normal and breath sounds normal. No stridor.  Abdominal: Bowel sounds are normal.  Lymphadenopathy:    She has no cervical adenopathy.  Neurological: She is alert and oriented to person, place, and time.  Skin: Skin is warm and dry. She is not diaphoretic.  Psychiatric: She has a normal mood and affect. Her behavior is normal.    BP 120/70   Pulse 61   Ht 5\' 4"  (1.626 m)   Wt 214 lb 2 oz (97.1 kg)   SpO2 100%   BMI 36.75 kg/m  Wt Readings from Last 3 Encounters:  01/16/18 214 lb 2 oz (97.1 kg)  12/22/17 210 lb (95.3 kg)  07/28/17 213 lb (96.6 kg)   BP Readings from Last 3 Encounters:  01/16/18 120/70  12/22/17 126/84  07/28/17 138/80   There are no preventive care reminders to display for this patient.  There are no preventive care reminders to display for this patient.  Lab Results  Component Value Date   TSH 1.97  08/02/2017   Lab Results  Component Value Date   WBC 6.6 12/22/2017   HGB 14.0 12/22/2017   HCT 44.8 12/22/2017   MCV 86.2 12/22/2017   PLT 294 12/22/2017   Lab Results  Component Value Date   NA 140 12/22/2017   K 3.3 (L) 12/22/2017   CO2 26 12/22/2017   GLUCOSE 87 12/22/2017   BUN 15 12/22/2017   CREATININE 0.87 12/22/2017   CALCIUM 9.1 12/22/2017   ANIONGAP 10 12/22/2017   GFR 85.12 08/02/2017   No results found for: CHOL No results found for: HDL No results found for: LDLCALC No results found for: TRIG  No results found for: CHOLHDL No results found for: ZOXW9U    Assessment & Plan:   Problem List Items Addressed This Visit      Cardiovascular and Mediastinum   Essential hypertension - Primary   Relevant Orders   CBC   Comprehensive metabolic panel   Urinalysis, Routine w reflex microscopic     Other   Class 2 obesity due to excess calories without serious comorbidity with body mass index (BMI) of 35.0 to 35.9 in adult   Relevant Orders   VITAMIN D 25 Hydroxy (Vit-D Deficiency, Fractures)   Amb ref to Medical Nutrition Therapy-MNT   Health care maintenance   Relevant Orders   CBC   Comprehensive metabolic panel   LDL cholesterol, direct   Lipid panel      No orders of the defined types were placed in this encounter. Patient will return fasting for above ordered blood work.  Patient agreed to see the clinical attrition asked for follow-up of her obesity.  Patient was given information on health maintenance for disease prevention and BMI information.  Continue current medical therapy for hypertension.  Encourage patient to incorporate exercise into her weekly routines.  Follow-up: No follow-ups on file.

## 2018-01-31 ENCOUNTER — Other Ambulatory Visit: Payer: Self-pay | Admitting: Orthopedic Surgery

## 2018-02-03 NOTE — Pre-Procedure Instructions (Signed)
Marie RuthsDacia Chamberlain  02/03/2018      [id number] [place]ette Otto- [place], [place] - [address] [facility] [address] [place] [id number] Phone: [phone] Fax: [phone]    Your procedure is scheduled on 02/14/18.  [facility] at [facility].M.  Call this number if you have problems the morning of surgery:  [phone]   Remember:  Do not eat or drink after midnight.      Take these medicines the morning of surgery with A SIP OF WATER ---norvasc,prilosec    Do not wear jewelry, make-up or nail polish.  Do not wear lotions, powders, or perfumes, or deodorant.  Do not shave 48 hours prior to surgery.  Men may shave face and neck.  Do not bring valuables to the hospital.  [facility] not responsible for any belongings or valuables.  Contacts, dentures or bridgework may not be worn into surgery.  Leave your suitcase in the car.  After surgery it may be brought to your room.  For patients admitted to the hospital, discharge time will be determined by your treatment team.  Patients discharged the day of surgery will not be allowed to drive home.   Name and phone number of your driver:   Do not take any aspirin,anti-inflammatories,vitamins,or herbal supplements 5-7 days prior to surgery. Special instructions:  [place] - Preparing for Surgery  Before surgery, you can play an important role.  Because skin is not sterile, your skin needs to be as free of germs as possible.  You can reduce the number of germs on you skin by washing with CHG (chlorahexidine gluconate) soap before surgery.  CHG is an antiseptic cleaner which kills germs and bonds with the skin to continue killing germs even after washing.  Oral Hygiene is also important in reducing the risk of infection.  Remember to brush your teeth with your regular toothpaste the morning of surgery.  Please DO NOT use if you have an allergy to CHG or  antibacterial soaps.  If your skin becomes reddened/irritated stop using the CHG and inform your nurse when you arrive at Short Stay.  Do not shave (including legs and underarms) for at least 48 hours prior to the first CHG shower.  You may shave your face.  Please follow these instructions carefully:   1.  Shower with CHG Soap the night before surgery and the morning of Surgery.  2.  If you choose to wash your hair, wash your hair first as usual with your normal shampoo.  3.  After you shampoo, rinse your hair and body thoroughly to remove the shampoo. 4.  Use CHG as you would any other liquid soap.  You can apply chg directly to the skin and wash gently with a      scrungie or washcloth.           5.  Apply the CHG Soap to your body ONLY FROM THE NECK DOWN.   Do not use on open wounds or open sores. Avoid contact with your eyes, ears, mouth and genitals (private parts).  Wash genitals (private parts) with your normal soap.  6.  Wash thoroughly, paying special attention to the area where your surgery will be performed.  7.  Thoroughly rinse your body with warm water from the neck down.  8.  DO NOT shower/wash with your normal soap after using and rinsing off  the CHG Soap.  9.  Pat yourself dry with a clean towel.            10.  Wear clean pajamas.            11.  Place clean sheets on your bed the night of your first shower and do not sleep with pets.  Day of Surgery  Do not apply any lotions/deoderants the morning of surgery.   Please wear clean clothes to the hospital/surgery center. Remember to brush your teeth with toothpaste.    Please read over the following fact sheets that you were given.

## 2018-02-06 ENCOUNTER — Encounter (HOSPITAL_COMMUNITY): Payer: Self-pay

## 2018-02-06 ENCOUNTER — Encounter (HOSPITAL_COMMUNITY)
Admission: RE | Admit: 2018-02-06 | Discharge: 2018-02-06 | Disposition: A | Discharge status: Home / Self Care | Payer: BLUE CROSS/BLUE SHIELD | Source: Ambulatory Visit | Attending: Orthopedic Surgery | Admitting: Orthopedic Surgery

## 2018-02-06 DIAGNOSIS — M75102 Unspecified rotator cuff tear or rupture of left shoulder, not specified as traumatic: Secondary | ICD-10-CM | POA: Diagnosis not present

## 2018-02-06 DIAGNOSIS — Z01812 Encounter for preprocedural laboratory examination: Secondary | ICD-10-CM | POA: Insufficient documentation

## 2018-02-06 HISTORY — DX: Gastro-esophageal reflux disease without esophagitis: K21.9

## 2018-02-06 HISTORY — DX: Essential (primary) hypertension: I10

## 2018-02-06 LAB — CBC
HCT: 44 % (ref 36.0–46.0)
Hemoglobin: 13.8 g/dL (ref 12.0–15.0)
MCH: 28.4 pg (ref 26.0–34.0)
MCHC: 31.4 g/dL (ref 30.0–36.0)
MCV: 90.5 fL (ref 80.0–100.0)
NRBC: 0 % (ref 0.0–0.2)
PLATELETS: 250 10*3/uL (ref 150–400)
RBC: 4.86 MIL/uL (ref 3.87–5.11)
RDW: 15.4 % (ref 11.5–15.5)
WBC: 7.4 10*3/uL (ref 4.0–10.5)

## 2018-02-06 LAB — BASIC METABOLIC PANEL
Anion gap: 11 (ref 5–15)
BUN: 16 mg/dL (ref 6–20)
CALCIUM: 9.5 mg/dL (ref 8.9–10.3)
CO2: 20 mmol/L — ABNORMAL LOW (ref 22–32)
Chloride: 108 mmol/L (ref 98–111)
Creatinine, Ser: 0.94 mg/dL (ref 0.44–1.00)
GFR calc Af Amer: >60 mL/min (ref >60)
GLUCOSE: 85 mg/dL (ref 70–99)
Potassium: 4.2 mmol/L (ref 3.5–5.1)
SODIUM: 139 mmol/L (ref 135–145)

## 2018-02-14 ENCOUNTER — Other Ambulatory Visit: Payer: Self-pay

## 2018-02-14 ENCOUNTER — Ambulatory Visit (HOSPITAL_COMMUNITY): Payer: BLUE CROSS/BLUE SHIELD | Admitting: Certified Registered Nurse Anesthetist

## 2018-02-14 ENCOUNTER — Encounter (HOSPITAL_COMMUNITY)
Admission: RE | Disposition: A | Discharge status: Home / Self Care | Payer: Self-pay | Source: Home / Self Care | Attending: Orthopedic Surgery

## 2018-02-14 ENCOUNTER — Encounter (HOSPITAL_COMMUNITY): Payer: Self-pay | Admitting: Certified Registered Nurse Anesthetist

## 2018-02-14 ENCOUNTER — Ambulatory Visit (HOSPITAL_COMMUNITY)
Admission: RE | Admit: 2018-02-14 | Discharge: 2018-02-14 | Disposition: A | Discharge status: Home / Self Care | Payer: BLUE CROSS/BLUE SHIELD | Attending: Orthopedic Surgery | Admitting: Orthopedic Surgery

## 2018-02-14 DIAGNOSIS — M75102 Unspecified rotator cuff tear or rupture of left shoulder, not specified as traumatic: Secondary | ICD-10-CM | POA: Insufficient documentation

## 2018-02-14 DIAGNOSIS — Z791 Long term (current) use of non-steroidal anti-inflammatories (NSAID): Secondary | ICD-10-CM | POA: Insufficient documentation

## 2018-02-14 DIAGNOSIS — K219 Gastro-esophageal reflux disease without esophagitis: Secondary | ICD-10-CM | POA: Insufficient documentation

## 2018-02-14 DIAGNOSIS — I10 Essential (primary) hypertension: Secondary | ICD-10-CM | POA: Insufficient documentation

## 2018-02-14 DIAGNOSIS — Z79899 Other long term (current) drug therapy: Secondary | ICD-10-CM | POA: Diagnosis not present

## 2018-02-14 DIAGNOSIS — Z9884 Bariatric surgery status: Secondary | ICD-10-CM | POA: Diagnosis not present

## 2018-02-14 DIAGNOSIS — M25812 Other specified joint disorders, left shoulder: Secondary | ICD-10-CM | POA: Diagnosis not present

## 2018-02-14 HISTORY — PX: SHOULDER ARTHROSCOPY WITH ROTATOR CUFF REPAIR: SHX5685

## 2018-02-14 LAB — NO BLOOD PRODUCTS

## 2018-02-14 SURGERY — ARTHROSCOPY, SHOULDER, WITH ROTATOR CUFF REPAIR
Anesthesia: General | Site: Shoulder | Laterality: Left

## 2018-02-14 MED ORDER — OXYCODONE-ACETAMINOPHEN 5-325 MG PO TABS: 1.0000 | ORAL_TABLET | ORAL | 0 refills | Status: AC | PRN

## 2018-02-14 MED ORDER — POVIDONE-IODINE 10 % EX SWAB
2.0000 "application " | Freq: Once | CUTANEOUS | Status: AC
  Administered 2018-02-14: 2 via TOPICAL

## 2018-02-14 MED ORDER — DEXAMETHASONE SODIUM PHOSPHATE 10 MG/ML IJ SOLN
INTRAMUSCULAR | Status: AC
  Filled 2018-02-14: qty 1

## 2018-02-14 MED ORDER — 0.9 % SODIUM CHLORIDE (POUR BTL) OPTIME
TOPICAL | Status: DC | PRN
  Administered 2018-02-14: 1000 mL

## 2018-02-14 MED ORDER — ROCURONIUM BROMIDE 50 MG/5ML IV SOSY
PREFILLED_SYRINGE | INTRAVENOUS | Status: AC
  Filled 2018-02-14: qty 5

## 2018-02-14 MED ORDER — PROPOFOL 10 MG/ML IV BOLUS
INTRAVENOUS | Status: DC | PRN
  Administered 2018-02-14: 150 mg via INTRAVENOUS

## 2018-02-14 MED ORDER — MIDAZOLAM HCL 2 MG/2ML IJ SOLN
INTRAMUSCULAR | Status: AC
  Filled 2018-02-14: qty 2

## 2018-02-14 MED ORDER — ONDANSETRON HCL 4 MG/2ML IJ SOLN
INTRAMUSCULAR | Status: AC
  Filled 2018-02-14: qty 2

## 2018-02-14 MED ORDER — OXYCODONE HCL 5 MG/5ML PO SOLN: 5.0000 mg | Freq: Once | ORAL | Status: DC | PRN

## 2018-02-14 MED ORDER — SODIUM CHLORIDE 0.9 % IV SOLN
INTRAVENOUS | Status: DC | PRN
  Administered 2018-02-14: 45 ug/min via INTRAVENOUS

## 2018-02-14 MED ORDER — FENTANYL CITRATE (PF) 100 MCG/2ML IJ SOLN: 25.0000 ug | INTRAMUSCULAR | Status: DC | PRN

## 2018-02-14 MED ORDER — EPHEDRINE 5 MG/ML INJ
INTRAVENOUS | Status: AC
  Filled 2018-02-14: qty 10

## 2018-02-14 MED ORDER — SUGAMMADEX SODIUM 200 MG/2ML IV SOLN
INTRAVENOUS | Status: DC | PRN
  Administered 2018-02-14: 300 mg via INTRAVENOUS

## 2018-02-14 MED ORDER — DEXAMETHASONE SODIUM PHOSPHATE 10 MG/ML IJ SOLN
INTRAMUSCULAR | Status: DC | PRN
  Administered 2018-02-14: 10 mg via INTRAVENOUS

## 2018-02-14 MED ORDER — FENTANYL CITRATE (PF) 250 MCG/5ML IJ SOLN
INTRAMUSCULAR | Status: AC
  Filled 2018-02-14: qty 5

## 2018-02-14 MED ORDER — SODIUM CHLORIDE 0.9 % IR SOLN
Status: DC | PRN
  Administered 2018-02-14 (×2): 3000 mL

## 2018-02-14 MED ORDER — ONDANSETRON HCL 4 MG/2ML IJ SOLN: 4.0000 mg | Freq: Once | INTRAMUSCULAR | Status: DC | PRN

## 2018-02-14 MED ORDER — ONDANSETRON HCL 4 MG/2ML IJ SOLN
INTRAMUSCULAR | Status: DC | PRN
  Administered 2018-02-14: 4 mg via INTRAVENOUS

## 2018-02-14 MED ORDER — LACTATED RINGERS IV SOLN
INTRAVENOUS | Status: DC
  Administered 2018-02-14: 09:00:00 via INTRAVENOUS

## 2018-02-14 MED ORDER — OXYCODONE HCL 5 MG PO TABS: 5.0000 mg | ORAL_TABLET | Freq: Once | ORAL | Status: DC | PRN

## 2018-02-14 MED ORDER — FENTANYL CITRATE (PF) 100 MCG/2ML IJ SOLN
INTRAMUSCULAR | Status: AC
  Administered 2018-02-14: 100 ug via INTRAVENOUS
  Filled 2018-02-14: qty 2

## 2018-02-14 MED ORDER — CEFAZOLIN SODIUM-DEXTROSE 2-4 GM/100ML-% IV SOLN
2.0000 g | INTRAVENOUS | Status: AC
  Administered 2018-02-14: 2 g via INTRAVENOUS
  Filled 2018-02-14: qty 100

## 2018-02-14 MED ORDER — MIDAZOLAM HCL 2 MG/2ML IJ SOLN
2.0000 mg | Freq: Once | INTRAMUSCULAR | Status: AC
  Administered 2018-02-14: 2 mg via INTRAVENOUS

## 2018-02-14 MED ORDER — LIDOCAINE 2% (20 MG/ML) 5 ML SYRINGE
INTRAMUSCULAR | Status: AC
  Filled 2018-02-14: qty 5

## 2018-02-14 MED ORDER — ROCURONIUM BROMIDE 50 MG/5ML IV SOSY
PREFILLED_SYRINGE | INTRAVENOUS | Status: AC
  Filled 2018-02-14: qty 10

## 2018-02-14 MED ORDER — BUPIVACAINE LIPOSOME 1.3 % IJ SUSP
INTRAMUSCULAR | Status: DC | PRN
  Administered 2018-02-14: 10 mL

## 2018-02-14 MED ORDER — BUPIVACAINE HCL (PF) 0.5 % IJ SOLN
INTRAMUSCULAR | Status: DC | PRN
  Administered 2018-02-14: 15 mL via PERINEURAL

## 2018-02-14 MED ORDER — MIDAZOLAM HCL 2 MG/2ML IJ SOLN
INTRAMUSCULAR | Status: AC
  Administered 2018-02-14: 2 mg via INTRAVENOUS
  Filled 2018-02-14: qty 2

## 2018-02-14 MED ORDER — FENTANYL CITRATE (PF) 100 MCG/2ML IJ SOLN
100.0000 ug | Freq: Once | INTRAMUSCULAR | Status: AC
  Administered 2018-02-14: 100 ug via INTRAVENOUS

## 2018-02-14 MED ORDER — LIDOCAINE 2% (20 MG/ML) 5 ML SYRINGE
INTRAMUSCULAR | Status: DC | PRN
  Administered 2018-02-14: 100 mg via INTRAVENOUS

## 2018-02-14 MED ORDER — ROCURONIUM BROMIDE 10 MG/ML (PF) SYRINGE
PREFILLED_SYRINGE | INTRAVENOUS | Status: DC | PRN
  Administered 2018-02-14: 20 mg via INTRAVENOUS
  Administered 2018-02-14: 50 mg via INTRAVENOUS

## 2018-02-14 SURGICAL SUPPLY — 57 items
ANCHOR PEEK 4.75X19.1 SWLK C (Anchor) ×4 IMPLANT
BLADE SURG 11 STRL SS (BLADE) ×2 IMPLANT
BURR OVAL 8 FLU 4.0X13 (MISCELLANEOUS) ×2 IMPLANT
CANNULA 5.75X71 LONG (CANNULA) ×2 IMPLANT
CANNULA TWIST IN 8.25X7CM (CANNULA) ×2 IMPLANT
CHLORAPREP W/TINT 26ML (MISCELLANEOUS) ×2 IMPLANT
COVER SURGICAL LIGHT HANDLE (MISCELLANEOUS) ×2 IMPLANT
COVER WAND RF STERILE (DRAPES) ×2 IMPLANT
DRAPE INCISE IOBAN 66X45 STRL (DRAPES) ×2 IMPLANT
DRAPE STERI 35X30 U-POUCH (DRAPES) ×2 IMPLANT
DRAPE SURG 17X23 STRL (DRAPES) ×2 IMPLANT
DRAPE U-SHAPE 47X51 STRL (DRAPES) ×2 IMPLANT
DRAPE U-SHAPE 76X120 STRL (DRAPES) ×4 IMPLANT
DRSG PAD ABDOMINAL 8X10 ST (GAUZE/BANDAGES/DRESSINGS) ×6 IMPLANT
DRSG XEROFORM 1X8 (GAUZE/BANDAGES/DRESSINGS) ×2 IMPLANT
GAUZE SPONGE 4X4 12PLY STRL (GAUZE/BANDAGES/DRESSINGS) ×2 IMPLANT
GAUZE XEROFORM 1X8 LF (GAUZE/BANDAGES/DRESSINGS) ×2 IMPLANT
GLOVE BIO SURGEON STRL SZ7 (GLOVE) IMPLANT
GLOVE BIO SURGEON STRL SZ7.5 (GLOVE) ×2 IMPLANT
GLOVE BIOGEL PI IND STRL 7.0 (GLOVE) IMPLANT
GLOVE BIOGEL PI IND STRL 8 (GLOVE) ×1 IMPLANT
GLOVE BIOGEL PI INDICATOR 7.0 (GLOVE)
GLOVE BIOGEL PI INDICATOR 8 (GLOVE) ×1
GOWN STRL REUS W/ TWL LRG LVL3 (GOWN DISPOSABLE) ×2 IMPLANT
GOWN STRL REUS W/TWL LRG LVL3 (GOWN DISPOSABLE) ×2
KIT BASIN OR (CUSTOM PROCEDURE TRAY) ×2 IMPLANT
KIT TURNOVER KIT B (KITS) ×2 IMPLANT
MANIFOLD NEPTUNE II (INSTRUMENTS) ×2 IMPLANT
NEEDLE SCORPION MULTI FIRE (NEEDLE) ×2 IMPLANT
NEEDLE SPNL 18GX3.5 QUINCKE PK (NEEDLE) ×2 IMPLANT
NS IRRIG 1000ML POUR BTL (IV SOLUTION) ×2 IMPLANT
PACK SHOULDER (CUSTOM PROCEDURE TRAY) ×2 IMPLANT
PAD ABD 8X10 STRL (GAUZE/BANDAGES/DRESSINGS) ×2 IMPLANT
PAD ARMBOARD 7.5X6 YLW CONV (MISCELLANEOUS) ×4 IMPLANT
PROBE BIPOLAR ATHRO 135MM 90D (MISCELLANEOUS) ×2 IMPLANT
RESECTOR FULL RADIUS 4.2MM (BLADE) ×2 IMPLANT
RESTRAINT HEAD UNIVERSAL NS (MISCELLANEOUS) ×2 IMPLANT
SLING ARM FOAM STRAP LRG (SOFTGOODS) ×2 IMPLANT
SLING ARM FOAM STRAP MED (SOFTGOODS) IMPLANT
SPONGE LAP 4X18 RFD (DISPOSABLE) IMPLANT
SUPPORT WRAP ARM LG (MISCELLANEOUS) IMPLANT
SUT 2 FIBERLOOP 20 STRT BLUE (SUTURE)
SUT ETHILON 3 0 PS 1 (SUTURE) ×2 IMPLANT
SUT FIBERWIRE #2 38 T-5 BLUE (SUTURE)
SUT TIGER TAPE 7 IN WHITE (SUTURE) IMPLANT
SUTURE 2 FIBERLOOP 20 STRT BLU (SUTURE) IMPLANT
SUTURE FIBERWR #2 38 T-5 BLUE (SUTURE) IMPLANT
SUTURE TAPE 1.3 40 TPR END (SUTURE) ×2 IMPLANT
SUTURETAPE 1.3 40 TPR END (SUTURE) ×4
SYR CONTROL 10ML LL (SYRINGE) ×2 IMPLANT
TAPE FIBER 2MM 7IN #2 BLUE (SUTURE) IMPLANT
TOWEL OR 17X24 6PK STRL BLUE (TOWEL DISPOSABLE) ×2 IMPLANT
TOWEL OR 17X26 10 PK STRL BLUE (TOWEL DISPOSABLE) ×2 IMPLANT
TUBE CONNECTING 12X1/4 (SUCTIONS) ×2 IMPLANT
TUBING ARTHROSCOPY IRRIG 16FT (MISCELLANEOUS) ×2 IMPLANT
WAND HAND CNTRL MULTIVAC 90 (MISCELLANEOUS) IMPLANT
WATER STERILE IRR 1000ML POUR (IV SOLUTION) ×2 IMPLANT

## 2018-02-14 NOTE — Anesthesia Postprocedure Evaluation (Signed)
Anesthesia Post Note  Patient: Marie BitterDacia N Contreras  Procedure(s) Performed: SHOULDER ARTHROSCOPY WITH ROTATOR CUFF REPAIR AND SUBACROMIAL DECOMPRESSION. (Left Shoulder)     Patient location during evaluation: PACU Anesthesia Type: General Level of consciousness: awake and alert Pain management: pain level controlled Vital Signs Assessment: post-procedure vital signs reviewed and stable Respiratory status: spontaneous breathing, nonlabored ventilation and respiratory function stable Cardiovascular status: blood pressure returned to baseline and stable Postop Assessment: no apparent nausea or vomiting Anesthetic complications: no    Last Vitals:  Vitals:   02/14/18 1215 02/14/18 1230  BP:  112/75  Pulse: (!) 52 62  Resp: 19 20  Temp:  (!) 36.1 C  SpO2: 98% 98%    Last Pain:  Vitals:   02/14/18 1230  TempSrc:   PainSc: 0-No pain                 Lucretia Kernarolyn E Witman

## 2018-02-14 NOTE — Discharge Instructions (Signed)

## 2018-02-14 NOTE — Op Note (Signed)
Procedure(s): SHOULDER ARTHROSCOPY WITH ROTATOR CUFF REPAIR AND SUBACROMIAL DECOMPRESSION. Procedure Note  Marie Contreras female 47 y.o. 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  Estimated Blood Loss: Min         Drains: none  Blood Given: none         Specimens: none        Complications:  * No complications entered in OR log *         Disposition: PACU - hemodynamically stable.         Condition: stable    Procedure:   INDICATIONS FOR SURGERY: The patient is 47 y.o. female who has had a history of left shoulder pain which is failed conservative management.  MRI shows high-grade partial or small full-thickness rotator cuff tear.  Indicated for surgical treatment to try and decrease pain and restore function.  OPERATIVE FINDINGS: Examination under anesthesia: No stiffness or instability.   DESCRIPTION OF PROCEDURE: The patient was identified in preoperative  holding area where I personally marked the operative site after  verifying site, side, and procedure with the patient. An interscalene block was given by the attending anesthesiologist the holding area.  The patient was taken back to the operating room where general anesthesia was induced without complication and was placed in the beach-chair position with the back  elevated about 60 degrees and all extremities and  head and neck carefully padded and  positioned.   The left upper extremity was then prepped and  draped in a standard sterile fashion. The appropriate time-out  procedure was carried out. The patient did receive IV antibiotics  within 30 minutes of incision.   A small posterior portal incision was made and the arthroscope was introduced into the joint. An anterior portal was then established above the subscapularis using needle localization. Small cannula was placed anteriorly. Diagnostic arthroscopy was then carried out.  The subscapularis was noted to be intact.  Biceps tendon was intact with fraying of the superior labrum, debrided back to stable surface.  The undersurface of the rotator cuff had extensive partial tearing which was debrided back and noted to be about 50% of the thickness of the tendon.  There was did not appear to be extension full-thickness.  The arthroscope was then introduced into the subacromial space a standard lateral portal was established with needle localization. The shaver was used through the lateral portal to perform extensive bursectomy. Coracoacromial ligament was examined and found to be frayed indicating chronic impingement.  The bursal surface of the rotator cuff was carefully probed and anteriorly there was full-thickness penetration in the supraspinatus tear.  This was developed and debrided.  The bursal side of the rotator cuff otherwise was intact.  The tuberosity was debrided down to bleeding bone and serrated promote healing and the repair was then carried out by first placing one 4.75 peek swivel lock anchor just off the articular margin preloaded with 2 suture tapes.  These 4 suture strands were  passed evenly throughout the tear and then brought over to 1 additional 4.7 peek swivel lock anchor securing the tendon nicely down to the prepared bone.  The repair was complete and watertight.  The coracoacromial ligament was taken down off the anterior acromion  with the ArthroCare exposing a moderate hooked anterior acromial spur with anterior os. A high-speed bur was then used through the lateral portal to take down the anterior acromial spur from lateral to medial in a standard acromioplasty.  The acromioplasty was also viewed from the lateral portal and the bur was used as necessary to ensure that the acromion was completely flat from posterior to anterior.  The arthroscopic equipment was removed from the joint and the portals were closed with 3-0 nylon in an interrupted fashion. Sterile dressings were then applied including Xeroform 4 x 4's ABDs and tape. The patient was then allowed to awaken from general anesthesia, placed in a sling, transferred to the stretcher and taken to the recovery room in stable condition.   POSTOPERATIVE PLAN: The patient will be discharged home today and will followup in one week for suture removal and wound check.  She will follow the standard cuff protocol.

## 2018-02-14 NOTE — Anesthesia Preprocedure Evaluation (Signed)
Anesthesia Evaluation  Patient identified by MRN, date of birth, ID band Patient awake    Reviewed: Allergy & Precautions, NPO status , Patient's Chart, lab work & pertinent test results  History of Anesthesia Complications Negative for: history of anesthetic complications  Airway Mallampati: II  TM Distance: >3 FB Neck ROM: Full    Dental no notable dental hx. (+) Teeth Intact   Pulmonary neg pulmonary ROS,    Pulmonary exam normal        Cardiovascular hypertension, Pt. on medications Normal cardiovascular exam     Neuro/Psych negative neurological ROS  negative psych ROS   GI/Hepatic Neg liver ROS, GERD  Medicated and Controlled,  Endo/Other  negative endocrine ROS  Renal/GU negative Renal ROS  negative genitourinary   Musculoskeletal negative musculoskeletal ROS (+)   Abdominal   Peds  Hematology negative hematology ROS (+)   Anesthesia Other Findings   Reproductive/Obstetrics                             Anesthesia Physical Anesthesia Plan  ASA: II  Anesthesia Plan: General   Post-op Pain Management: GA combined w/ Regional for post-op pain   Induction: Intravenous  PONV Risk Score and Plan: 3 and Ondansetron, Dexamethasone, Midazolam and Treatment may vary due to age or medical condition  Airway Management Planned: Oral ETT  Additional Equipment: None  Intra-op Plan:   Post-operative Plan: Extubation in OR  Informed Consent: I have reviewed the patients History and Physical, chart, labs and discussed the procedure including the risks, benefits and alternatives for the proposed anesthesia with the patient or authorized representative who has indicated his/her understanding and acceptance.   Dental advisory given  Plan Discussed with:   Anesthesia Plan Comments:         Anesthesia Quick Evaluation

## 2018-02-14 NOTE — Transfer of Care (Signed)
Immediate Anesthesia Transfer of Care Note  Patient: Lucia BitterDacia N Ruan  Procedure(s) Performed: SHOULDER ARTHROSCOPY WITH ROTATOR CUFF REPAIR AND SUBACROMIAL DECOMPRESSION. (Left Shoulder)  Patient Location: PACU  Anesthesia Type:General and GA combined with regional for post-op pain  Level of Consciousness: drowsy and patient cooperative  Airway & Oxygen Therapy: Patient Spontanous Breathing and Patient connected to nasal cannula oxygen  Post-op Assessment: Report given to RN, Post -op Vital signs reviewed and stable and Patient moving all extremities X 4  Post vital signs: Reviewed and stable  Last Vitals:  Vitals Value Taken Time  BP 116/80 02/14/2018 11:54 AM  Temp    Pulse 66 02/14/2018 11:57 AM  Resp 19 02/14/2018 11:57 AM  SpO2 98 % 02/14/2018 11:57 AM  Vitals shown include unvalidated device data.  Last Pain:  Vitals:   02/14/18 1155  TempSrc:   PainSc: (P) 0-No pain      Patients Stated Pain Goal: 4 (02/14/18 0910)  Complications: No apparent anesthesia complications

## 2018-02-14 NOTE — Anesthesia Procedure Notes (Signed)
Anesthesia Regional Block: Interscalene brachial plexus block   Pre-Anesthetic Checklist: ,, timeout performed, Correct Patient, Correct Site, Correct Laterality, Correct Procedure, Correct Position, site marked, Risks and benefits discussed,  Surgical consent,  Pre-op evaluation,  At surgeon's request and post-op pain management  Laterality: Left  Prep: chloraprep       Needles:  Injection technique: Single-shot  Needle Type: Echogenic Stimulator Needle     Needle Length: 10cm  Needle Gauge: 21     Additional Needles:   Procedures:,,,, ultrasound used (permanent image in chart),,,,  Narrative:  Start time: 02/14/2018 9:35 AM End time: 02/14/2018 9:43 AM Injection made incrementally with aspirations every 5 mL.  Performed by: Personally  Anesthesiologist: Lucretia KernWitman, Wenceslao Loper E, MD  Additional Notes: Monitors applied. Injection made in 5cc increments. No resistance to injection. Good needle visualization. Patient tolerated procedure well.

## 2018-02-14 NOTE — Anesthesia Procedure Notes (Signed)
Procedure Name: Intubation Date/Time: 02/14/2018 10:36 AM Performed by: Julieta Bellini, CRNA Pre-anesthesia Checklist: Patient identified, Emergency Drugs available, Suction available and Patient being monitored Patient Re-evaluated:Patient Re-evaluated prior to induction Oxygen Delivery Method: Circle system utilized Preoxygenation: Pre-oxygenation with 100% oxygen Induction Type: IV induction Ventilation: Mask ventilation without difficulty Laryngoscope Size: Mac and 3 Grade View: Grade I Tube type: Oral Tube size: 7.0 mm Number of attempts: 1 Airway Equipment and Method: Stylet Placement Confirmation: ETT inserted through vocal cords under direct vision,  positive ETCO2 and breath sounds checked- equal and bilateral Secured at: 22 cm Tube secured with: Tape Dental Injury: Teeth and Oropharynx as per pre-operative assessment

## 2018-02-14 NOTE — H&P (Signed)
Marie Contreras is an 47 y.o. female.   Chief Complaint: L shoulder pain and weakness  HPI: Left shoulder pain and weakness with severe high-grade rotator cuff tear by MRI, failed extensive conservative management.  Indicated for surgical management to decrease pain and restore function.  Past Medical History:  Diagnosis Date  . GERD (gastroesophageal reflux disease)   . Hypertension     Past Surgical History:  Procedure Laterality Date  . ABDOMINAL HYSTERECTOMY    . ABDOMINAL SURGERY    . arm lift    . CHOLECYSTECTOMY    . FOOT SURGERY    . LAPAROSCOPIC GASTRIC SLEEVE RESECTION      History reviewed. No pertinent family history. Social History:  reports that she has never smoked. She has never used smokeless tobacco. She reports current alcohol use. She reports that she does not use drugs.  Allergies:  Allergies  Allergen Reactions  . Aspirin Other (See Comments)    History of Sleeve Gastrectomy surgery   . Ibuprofen Other (See Comments)    History of Sleeve Gastrectomy surgery   . Tramadol Other (See Comments)    Hallucinations  . Codeine Hives  . Tape Other (See Comments)    Clear plastic    Medications Prior to Admission  Medication Sig Dispense Refill  . amLODipine (NORVASC) 5 MG tablet Take 5 mg by mouth daily.    . hydrochlorothiazide (HYDRODIURIL) 25 MG tablet Take 25 mg by mouth daily.    . meloxicam (MOBIC) 15 MG tablet Take 15 mg by mouth daily.    . methocarbamol (ROBAXIN) 500 MG tablet Take 500 mg by mouth every 8 (eight) hours as needed for muscle spasms.    Marland Kitchen. omeprazole (PRILOSEC) 20 MG capsule Take 20 mg by mouth daily as needed (heartburn).    . Potassium 99 MG TABS Take 99 mg by mouth daily.      No results found for this or any previous visit (from the past 48 hour(s)). No results found.  Review of Systems  All other systems reviewed and are negative.   Blood pressure (!) 141/82, pulse (!) 56, temperature 98.1 F (36.7 C), temperature source  Oral, resp. rate 20, height 5\' 4"  (1.626 m), weight 90.7 kg, SpO2 100 %. Physical Exam  Constitutional: She is oriented to person, place, and time. She appears well-developed and well-nourished.  HENT:  Head: Atraumatic.  Eyes: EOM are normal.  Cardiovascular: Intact distal pulses.  Respiratory: Effort normal.  Musculoskeletal:     Comments: L shoulder pain and weakness  Neurological: She is alert and oriented to person, place, and time.  Skin: Skin is warm and dry.  Psychiatric: She has a normal mood and affect.     Assessment/Plan Left shoulder pain and weakness with severe high-grade rotator cuff tear by MRI, failed extensive conservative management.  Indicated for surgical management to decrease pain and restore function. Plan left shoulder arthroscopic rotator cuff repair, subacromial decompression Risks / benefits of surgery discussed Consent on chart  NPO for OR Preop antibiotics   Berline LopesJustin W Kameron Blethen, MD 02/14/2018, 10:16 AM

## 2018-02-16 ENCOUNTER — Encounter (HOSPITAL_COMMUNITY): Payer: Self-pay | Admitting: Orthopedic Surgery

## 2018-04-11 ENCOUNTER — Other Ambulatory Visit: Payer: Self-pay | Admitting: Family Medicine

## 2018-04-11 MED ORDER — AMLODIPINE BESYLATE 5 MG PO TABS: 5.0000 mg | ORAL_TABLET | Freq: Every day | ORAL | 3 refills | Status: DC

## 2018-04-11 MED ORDER — HYDROCHLOROTHIAZIDE 25 MG PO TABS: 25.0000 mg | ORAL_TABLET | Freq: Every day | ORAL | 3 refills | Status: DC

## 2018-04-11 NOTE — Telephone Encounter (Signed)
Requested medication (s) are due for refill today:  yes  Requested medication (s) are on the active medication list:  yes  Future visit scheduled:  no  Last Refill: Historical provider on Amlodipine and Hydrochlorothiazide  Requested Prescriptions  Pending Prescriptions Disp Refills   amLODipine (NORVASC) 5 MG tablet      Sig: Take 1 tablet (5 mg total) by mouth daily.     Cardiovascular:  Calcium Channel Blockers Passed - 04/11/2018  1:43 PM      Passed - Last BP in normal range    BP Readings from Last 1 Encounters:  02/14/18 112/75         Passed - Valid encounter within last 6 months    Recent Outpatient Visits          2 months ago Essential hypertension   [facility] R[facility], Talmadge Coventry, MD   8 months ago Bradycardia   [facility], Talmadge Coventry, MD   1 year ago Essential hypertension   [facility] R[facility], Talmadge Coventry, MD            hydrochlorothiazide (HYDRODIURIL) 25 MG tablet      Sig: Take 1 tablet (25 mg total) by mouth daily.     Cardiovascular: Diuretics - Thiazide Passed - 04/11/2018  1:43 PM      Passed - Ca in normal range and within 360 days    Calcium  Date Value Ref Range Status  02/06/2018 9.5 8.9 - 10.3 mg/dL Final         Passed - Cr in normal range and within 360 days    Creatinine, Ser  Date Value Ref Range Status  02/06/2018 0.94 0.44 - 1.00 mg/dL Final         Passed - K in normal range and within 360 days    Potassium  Date Value Ref Range Status  02/06/2018 4.2 3.5 - 5.1 mmol/L Final         Passed - Na in normal range and within 360 days    Sodium  Date Value Ref Range Status  02/06/2018 139 135 - 145 mmol/L Final         Passed - Last BP in normal range    BP Readings from Last 1 Encounters:  02/14/18 112/75         Passed - Valid encounter within last 6 months    Recent Outpatient Visits          2 months ago Essential hypertension   [facility], Talmadge Coventry, MD   8 months ago Bradycardia   [facility], Talmadge Coventry, MD   1 year ago Essential hypertension   [facility] [place] Doreene Burke, Talmadge Coventry, MD

## 2018-04-11 NOTE — Telephone Encounter (Signed)
Copied from CRM [id number]. Topic: Quick Communication - Rx Refill/Question >> Apr 11, 2018  1:37 PM Mcneil, Ja-Kwan wrote: Medication: amLODipine (NORVASC) 5 MG tablet and hydrochlorothiazide (HYDRODIURIL) 25 MG tablet  Has the patient contacted their pharmacy? yes   Preferred Pharmacy (with phone number or street name): [id number] [place], [place] - [address] AT [facility] [address] [facility] & [address] [phone] (Phone) [phone] (Fax)  Agent: Please be advised that RX refills may take up to 3 business days. We ask that you follow-up with your pharmacy.

## 2018-05-25 DIAGNOSIS — E538 Deficiency of other specified B group vitamins: Secondary | ICD-10-CM | POA: Insufficient documentation

## 2018-05-25 DIAGNOSIS — J302 Other seasonal allergic rhinitis: Secondary | ICD-10-CM | POA: Insufficient documentation

## 2018-05-25 DIAGNOSIS — Z903 Acquired absence of stomach [part of]: Secondary | ICD-10-CM | POA: Insufficient documentation

## 2018-06-12 ENCOUNTER — Telehealth: Payer: Self-pay | Admitting: Family Medicine

## 2018-06-12 NOTE — Telephone Encounter (Signed)
Called patient on behalf of Dr Doreene Burke Because her 3 month follow up was supposed to be in March and I wanted to see if she would be fine with doing a virtual visit, She said her insurance is not in network and she is trying to get a new insurance that will be so she is going to hold off right now

## 2018-12-03 DIAGNOSIS — M47812 Spondylosis without myelopathy or radiculopathy, cervical region: Secondary | ICD-10-CM | POA: Insufficient documentation

## 2018-12-03 DIAGNOSIS — M5030 Other cervical disc degeneration, unspecified cervical region: Secondary | ICD-10-CM | POA: Insufficient documentation

## 2019-01-15 ENCOUNTER — Other Ambulatory Visit: Payer: Self-pay | Admitting: Family Medicine

## 2019-01-15 DIAGNOSIS — I10 Essential (primary) hypertension: Secondary | ICD-10-CM

## 2019-01-15 DIAGNOSIS — E6609 Other obesity due to excess calories: Secondary | ICD-10-CM

## 2019-01-15 DIAGNOSIS — Z0000 Encounter for general adult medical examination without abnormal findings: Secondary | ICD-10-CM

## 2019-05-24 ENCOUNTER — Ambulatory Visit: Payer: BLUE CROSS/BLUE SHIELD

## 2019-06-11 ENCOUNTER — Ambulatory Visit: Payer: BLUE CROSS/BLUE SHIELD | Attending: Internal Medicine

## 2019-07-24 IMAGING — CR DG CHEST 2V
2 series · 2 of 2 positions shown · non-contrast
Comparison: 01/29/2017

CLINICAL DATA: Mid chest pain for 2 days, hypertension

EXAM:
CHEST - 2 VIEW

[w chest pa]
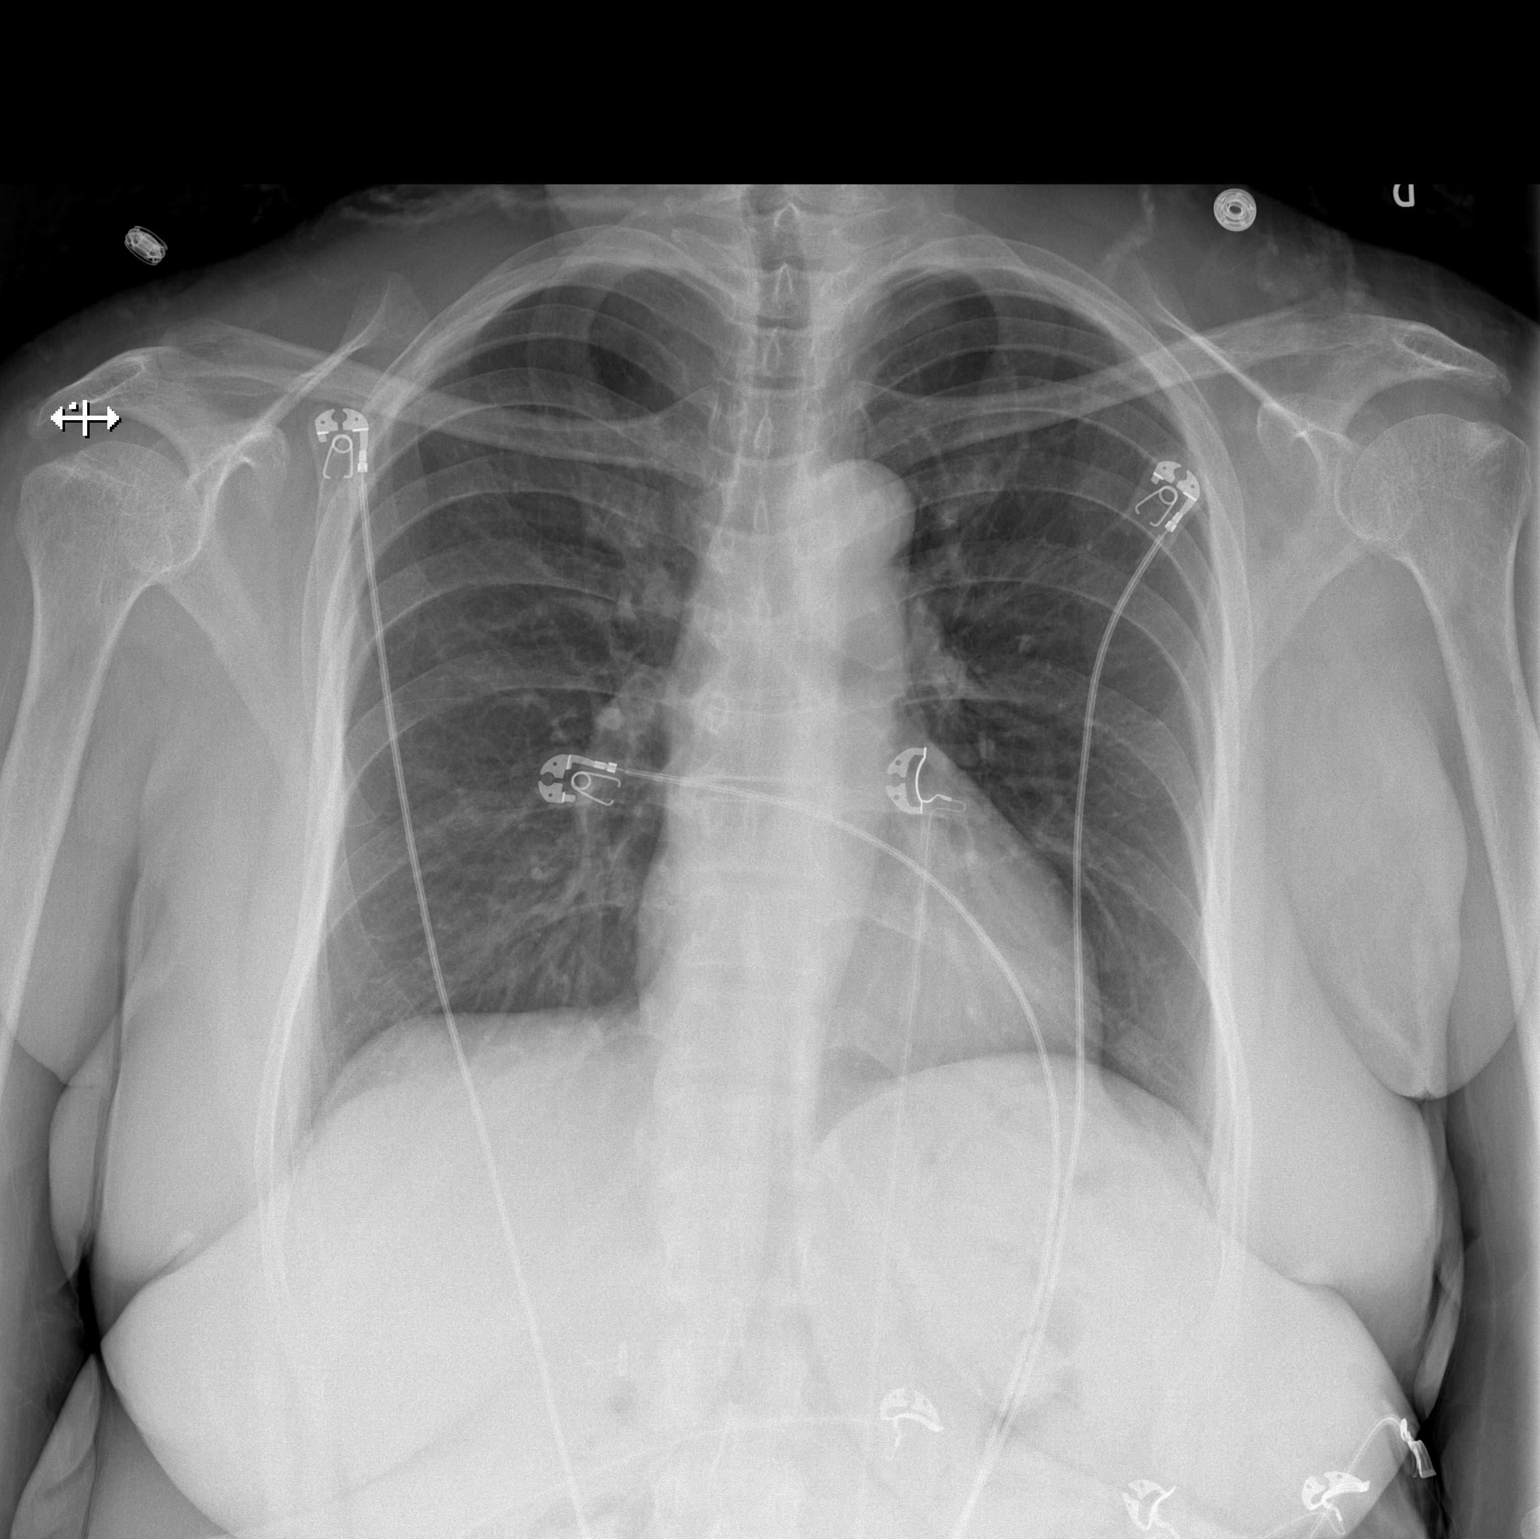

[w chest lat]
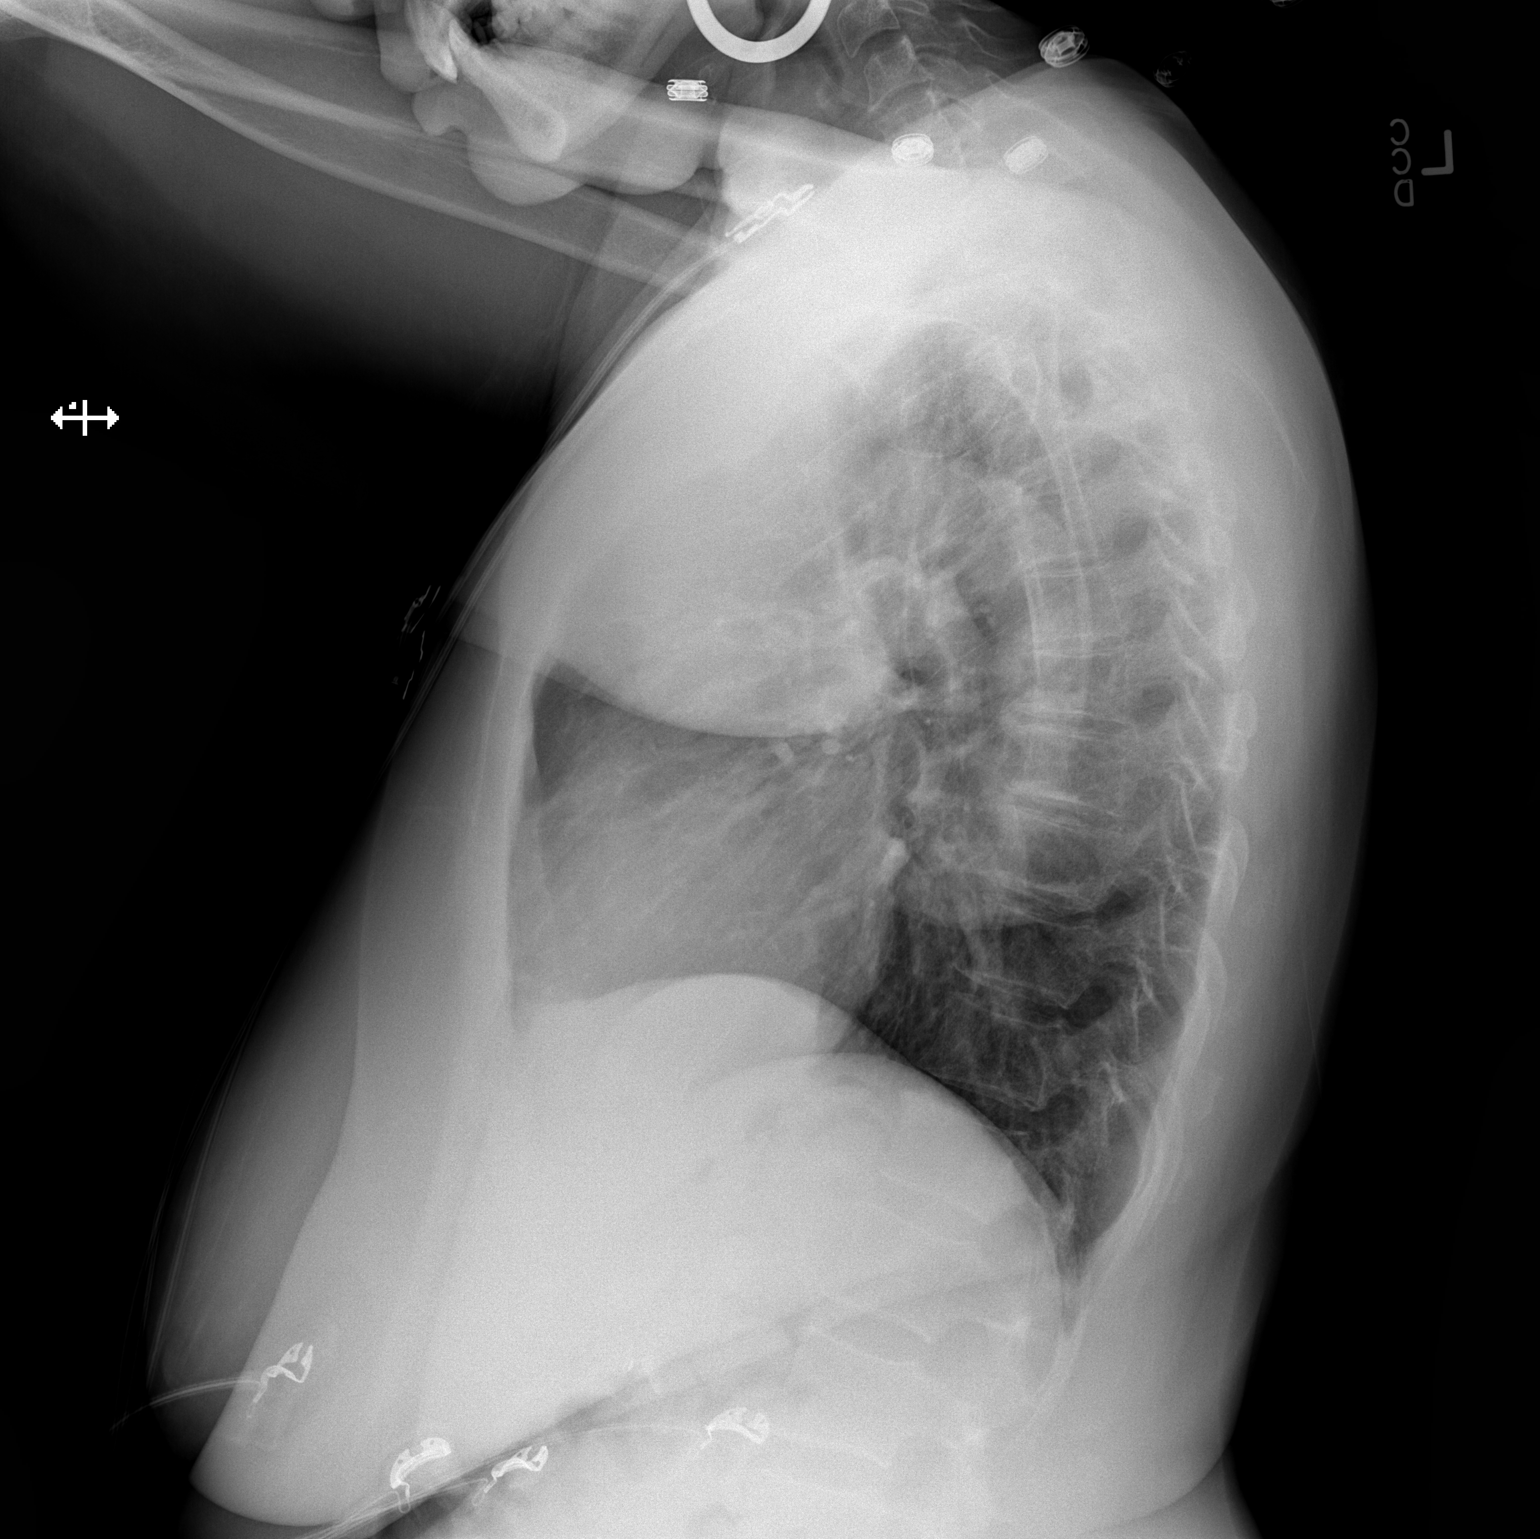

[2 of 2 positions shown; findings below may reference images not displayed]

FINDINGS: Upper normal heart size.

Mediastinal contours and pulmonary vascularity normal.

Lungs clear.

No pulmonary infiltrate, pleural effusion or pneumothorax.

Surgical clips RIGHT upper quadrant question prior cholecystectomy.

No acute osseous findings.
IMPRESSION: No acute abnormalities.

## 2019-10-31 DIAGNOSIS — R1013 Epigastric pain: Secondary | ICD-10-CM | POA: Insufficient documentation

## 2020-02-07 DIAGNOSIS — D649 Anemia, unspecified: Secondary | ICD-10-CM | POA: Insufficient documentation

## 2021-06-11 DIAGNOSIS — L853 Xerosis cutis: Secondary | ICD-10-CM | POA: Insufficient documentation

## 2021-06-11 DIAGNOSIS — K219 Gastro-esophageal reflux disease without esophagitis: Secondary | ICD-10-CM | POA: Insufficient documentation

## 2021-06-11 DIAGNOSIS — R748 Abnormal levels of other serum enzymes: Secondary | ICD-10-CM | POA: Insufficient documentation

## 2021-06-11 DIAGNOSIS — D509 Iron deficiency anemia, unspecified: Secondary | ICD-10-CM | POA: Insufficient documentation

## 2022-03-16 DIAGNOSIS — M7661 Achilles tendinitis, right leg: Secondary | ICD-10-CM | POA: Insufficient documentation

## 2022-03-29 DIAGNOSIS — E611 Iron deficiency: Secondary | ICD-10-CM | POA: Insufficient documentation

## 2023-06-01 DIAGNOSIS — K4090 Unilateral inguinal hernia, without obstruction or gangrene, not specified as recurrent: Secondary | ICD-10-CM | POA: Insufficient documentation

## 2023-06-01 DIAGNOSIS — Z8719 Personal history of other diseases of the digestive system: Secondary | ICD-10-CM | POA: Insufficient documentation

## 2023-09-15 DIAGNOSIS — E66811 Obesity, class 1: Secondary | ICD-10-CM | POA: Insufficient documentation

## 2023-09-26 ENCOUNTER — Other Ambulatory Visit: Payer: Self-pay | Admitting: Medical Genetics

## 2023-10-26 ENCOUNTER — Other Ambulatory Visit: Payer: Self-pay

## 2023-11-05 ENCOUNTER — Other Ambulatory Visit: Payer: Self-pay | Attending: Medical Genetics

## 2023-12-09 ENCOUNTER — Other Ambulatory Visit: Payer: Self-pay | Admitting: Medical Genetics

## 2023-12-09 DIAGNOSIS — Z006 Encounter for examination for normal comparison and control in clinical research program: Secondary | ICD-10-CM

## 2024-01-02 LAB — GENECONNECT MOLECULAR SCREEN

## 2024-01-09 ENCOUNTER — Telehealth: Payer: Self-pay | Admitting: Medical Genetics

## 2024-01-09 DIAGNOSIS — Z006 Encounter for examination for normal comparison and control in clinical research program: Secondary | ICD-10-CM

## 2024-01-19 NOTE — Telephone Encounter (Signed)
 [place] [facility]  01/19/2024 9:26 AM  Confirmed I was speaking with Hennessy N Aldea [phone] by using name and DOB. Informed participant the reason for this call is to follow-up on a recent sample the participant provided at one of the [facility] lab locations. Informed participant the test was not able to be completed with this sample and apologized for the inconvenience. Participant was requested to provide a new sample at one of our participating labs at no cost so that participant can continue participation and receive test results. Informed participant they do not need to be fasting and if there are other samples that need to be drawn, they can be done at the same visit. Participant has not had a blood transfusion or blood product in the last 30 days. Participant agreed to provide another sample. Participant was provided the [employer] program website to learn why this may have happened. Participant was thanked for their time and continued support of the above study.    Jordyn Pennstrom, BS [place]  [facility] Clinical Research Specialist II Direct Dial: [phone]  Fax: [phone]

## 2024-02-06 LAB — GENECONNECT MOLECULAR SCREEN

## 2024-02-07 ENCOUNTER — Telehealth: Payer: Self-pay | Admitting: Medical Genetics

## 2024-02-07 DIAGNOSIS — Z006 Encounter for examination for normal comparison and control in clinical research program: Secondary | ICD-10-CM

## 2024-02-15 NOTE — Telephone Encounter (Signed)
 [place] [facility]  02/15/2024  10:08 AM  Confirmed I was speaking with Justyna N Jividen [phone] by using name and DOB. Informed participant the reason for this call is to follow-up on a recent sample the participant provided at one of the [facility] lab locations. Informed participant the test was not able to be completed with this sample and apologized for the inconvenience. Participant was requested to provide a new sample at one of our participating labs at no cost so that participant can continue participation and receive test results. Informed participant they do not need to be fasting and if there are other samples that need to be drawn, they can be done at the same visit. Participant has not had a blood transfusion or blood product in the last 30 days. Participant agreed to provide another sample. Participant was provided the [employer] program website to learn why this may have happened. Participant was thanked for their time and continued support of the above study.    Jordyn Pennstrom, BS [place]  [facility] Clinical Research Specialist II Direct Dial: [phone]  Fax: [phone]

## 2024-03-21 ENCOUNTER — Ambulatory Visit
Admission: EM | Admit: 2024-03-21 | Discharge: 2024-03-21 | Disposition: A | Discharge status: Home / Self Care | Source: Home / Self Care

## 2024-03-21 ENCOUNTER — Ambulatory Visit

## 2024-03-21 DIAGNOSIS — M436 Torticollis: Secondary | ICD-10-CM

## 2024-03-21 DIAGNOSIS — I10 Essential (primary) hypertension: Secondary | ICD-10-CM | POA: Diagnosis not present

## 2024-03-21 DIAGNOSIS — M545 Low back pain, unspecified: Secondary | ICD-10-CM | POA: Diagnosis not present

## 2024-03-21 DIAGNOSIS — M5030 Other cervical disc degeneration, unspecified cervical region: Secondary | ICD-10-CM | POA: Diagnosis not present

## 2024-03-21 DIAGNOSIS — R001 Bradycardia, unspecified: Secondary | ICD-10-CM

## 2024-03-21 MED ORDER — TIZANIDINE HCL 4 MG PO TABS: 4.0000 mg | ORAL_TABLET | Freq: Three times a day (TID) | ORAL | 0 refills | Status: AC | PRN

## 2024-03-21 MED ORDER — KETOROLAC TROMETHAMINE 60 MG/2ML IM SOLN
30.0000 mg | Freq: Once | INTRAMUSCULAR | Status: AC
  Administered 2024-03-21: 30 mg via INTRAMUSCULAR

## 2024-03-21 MED ORDER — AMLODIPINE BESYLATE 5 MG PO TABS: 5.0000 mg | ORAL_TABLET | Freq: Every day | ORAL | 0 refills | Status: AC

## 2024-03-21 MED ORDER — LIDOCAINE 5 % EX PTCH: 1.0000 | MEDICATED_PATCH | CUTANEOUS | 0 refills | Status: AC

## 2024-03-21 NOTE — Discharge Instructions (Addendum)
-  X-ray shows one of your back bones is slid forward a little on another back bone. It is very mild and may not even be a cause for your pain--more like an incidental finding. As discussed you may need an MRI if pain last more than a few more weeks or you have numbness or weakness.  BACK PAIN: Stressed avoiding painful activities . RICE (REST, ICE, COMPRESSION, ELEVATION) guidelines reviewed. May alternate ice and heat. Consider use of muscle rubs, Salonpas patches, etc. Use medications as directed including muscle relaxers if prescribed. Take Tylenol  as needed for pain. May also use lidocaine  patches.  F/u with spine specialist.  BACK PAIN RED FLAGS: If the back pain acutely worsens or there are any red flag symptoms such as numbness/tingling, leg weakness, saddle anesthesia, or loss of bowel/bladder control, go immediately to the ER. Follow up with us  as scheduled or sooner if the pain does not begin to resolve or if it worsens before the follow up    -Elevated blood pressure.  I refilled your amlodipine . Make follow-up with your primary care provider since you missed  today's appointment and will need refills of amlopdine. -Discussed going to ED if any severe headaches, vision changes, numbness/tingling or weakness of extremities, chest pain, palpitations, shortness of breath.

## 2024-03-21 NOTE — ED Triage Notes (Addendum)
 Pt c/o lower back,spasms & neck pain x1 wk. Has tried OTC meds w/o relief. Last dose of tylenol  around 1800.   During intake BP noted to be high. Has been out of amlodipine  x1 mon. Had PCP appt sch for today but came in her d/t her not feeling well. Has slight HA currently.

## 2024-03-21 NOTE — ED Provider Notes (Signed)
 " [facility]    CSN: [id number] Arrival date & time: 03/21/24  1808      History   Chief Complaint Chief Complaint  Patient presents with   Hypertension   Back Pain   Headache    HPI Marie Contreras is a 54 y.o. female with history of acid reflux, degenerative disc disease and facet arthritis of cervical region, status post sleeve gastrectomy, bradycardia, ADD, hypertension and iron deficiency anemia.  Patient presents today for neck stiffness/mild pain, severe lower back pain with spasms and mild headache x 1 week.  Patient is most concerned regarding her lower back pain.  She reports sharp intermittent pains which feel like muscle spasms.  Pain does not radiate to upper or lower back.  No pain radiating to lower extremities, numbness/tingling or weakness.  No loss of bowel or bladder control.  No relief with Tylenol .  Has not been taking NSAIDs since she has a history of gastric bypass surgery and has been advised not to take NSAIDs. Has appointment with spine specialist in 3 weeks for low back pain.  BP high today at 220/110. Re-check 160/96. Patient was scheduled for PCP appointment this evening but did not make appointment. She has been out of amlodipine  for the past 1 month.  She reports being in a lot of pain and thinks that has elevated her blood pressure.  Patient denies confusion, dizziness, vision changes, nausea/vomiting, chest pain, palpitations or shortness of breath.   HPI  Past Medical History:  Diagnosis Date   GERD (gastroesophageal reflux disease)    Hypertension     Patient Active Problem List   Diagnosis Date Noted   Obesity, Class I, BMI 30-34.9 09/15/2023   Hx of hernia repair 06/01/2023   Right inguinal hernia 06/01/2023   Iron deficiency 03/29/2022   Achilles tendonitis, bilateral 03/16/2022   Dry skin dermatitis 06/11/2021   Elevated liver enzymes 06/11/2021   Gastroesophageal reflux disease 06/11/2021   Iron deficiency anemia 06/11/2021    Anemia 02/07/2020   Epigastric pain 10/31/2019   DDD (degenerative disc disease), cervical 12/03/2018   Facet arthritis of cervical region 12/03/2018   History of sleeve gastrectomy 05/25/2018   Seasonal allergies 05/25/2018   Vitamin B12 deficiency 05/25/2018   Preoperative clearance 07/28/2017   Bradycardia 07/28/2017   Right rotator cuff tendinitis 02/06/2016   Healthcare maintenance 08/11/2015   Acute pain of both knees 07/08/2015   Peroneal tendinitis, right leg 07/08/2015   Pes planus of both feet 07/08/2015   Vitamin D deficiency 04/07/2015   History of gastric bypass 03/26/2015   Mixed incontinence urge and stress 12/27/2012   Uterovaginal prolapse, incomplete 09/27/2012   Non morbid obesity due to excess calories 03/09/2012   ADD (attention deficit disorder) 02/26/2011   Primary hypertension 07/07/2010    Past Surgical History:  Procedure Laterality Date   ABDOMINAL HYSTERECTOMY     ABDOMINAL SURGERY     arm lift     CHOLECYSTECTOMY     FOOT SURGERY     LAPAROSCOPIC GASTRIC SLEEVE RESECTION     SHOULDER ARTHROSCOPY WITH ROTATOR CUFF REPAIR Left 02/14/2018   Procedure: SHOULDER ARTHROSCOPY WITH ROTATOR CUFF REPAIR AND SUBACROMIAL DECOMPRESSION.;  Surgeon: Dozier Soulier, MD;  Location: [facility] OR;  Service: Orthopedics;  Laterality: Left;  LENGTH:    OB History   No obstetric history on file.      Home Medications    Prior to Admission medications  Medication Sig Start Date End Date Taking? Authorizing  Provider  amLODipine  (NORVASC ) 5 MG tablet Take 1 tablet (5 mg total) by mouth daily. 03/21/24  Yes Arvis Huxley B, PA-C  lidocaine  (LIDODERM ) 5 % Place 1 patch onto the skin daily. Remove & Discard patch within 12 hours or as directed by MD 03/21/24  Yes Arvis Huxley B, PA-C  tiZANidine  (ZANAFLEX ) 4 MG tablet Take 1 tablet (4 mg total) by mouth every 8 (eight) hours as needed for up to 7 days. 03/21/24 03/28/24 Yes Arvis Huxley NOVAK, PA-C  omeprazole (PRILOSEC)  20 MG capsule Take 20 mg by mouth daily as needed (heartburn).    [provider]  Potassium 99 MG TABS Take 99 mg by mouth daily.    [provider]    Family History No family history on file.  Social History Social History[1]   Allergies   Aspirin, Ibuprofen , Tramadol, Codeine, Nsaids, Oxycodone , and Tape   Review of Systems Review of Systems  Constitutional:  Negative for fatigue.  Eyes:  Negative for photophobia and visual disturbance.  Respiratory:  Negative for shortness of breath.   Cardiovascular:  Negative for chest pain, palpitations and leg swelling.  Gastrointestinal:  Negative for nausea and vomiting.  Musculoskeletal:  Positive for back pain, neck pain and neck stiffness. Negative for gait problem.  Neurological:  Positive for headaches. Negative for dizziness, syncope, speech difficulty, weakness and numbness.  Psychiatric/Behavioral:  Negative for confusion.      Physical Exam Triage Vital Signs ED Triage Vitals [03/21/24 1824]  Encounter Vitals Group     BP (!) 220/110     Girls Systolic BP Percentile      Girls Diastolic BP Percentile      Boys Systolic BP Percentile      Boys Diastolic BP Percentile      Pulse Rate (!) 48     Resp 16     Temp 98 F (36.7 C)     Temp Source Oral     SpO2 100 %     Weight 180 lb (81.6 kg)     Height      Head Circumference      Peak Flow      Pain Score      Pain Loc      Pain Education      Exclude from Growth Chart    No data found.  Updated Vital Signs BP (!) 160/96 (BP Location: Left Arm)   Pulse (!) 48   Temp 98 F (36.7 C) (Oral)   Resp 16   Wt 180 lb (81.6 kg)   SpO2 100%   BMI 30.90 kg/m     Physical Exam Vitals and nursing note reviewed.  Constitutional:      General: She is not in acute distress.    Appearance: Normal appearance. She is not ill-appearing or toxic-appearing.  HENT:     Head: Normocephalic and atraumatic.     Nose: Nose normal.     Mouth/Throat:      Mouth: Mucous membranes are moist.     Pharynx: Oropharynx is clear.  Eyes:     General: No scleral icterus.       Right eye: No discharge.        Left eye: No discharge.     Extraocular Movements: Extraocular movements intact.     Conjunctiva/sclera: Conjunctivae normal.     Pupils: Pupils are equal, round, and reactive to light.  Cardiovascular:     Rate and Rhythm: Regular rhythm. Bradycardia present.  Heart sounds: Normal heart sounds.  Pulmonary:     Effort: Pulmonary effort is normal. No respiratory distress.     Breath sounds: Normal breath sounds.  Musculoskeletal:     Cervical back: Neck supple. Tenderness (bilateral paracervical muscles and traps) present. No bony tenderness. Pain with movement present. Decreased range of motion.     Lumbar back: Tenderness (generalized tenderness throughout lumbar region bilaterally) and bony tenderness present. Decreased range of motion.  Skin:    General: Skin is dry.  Neurological:     General: No focal deficit present.     Mental Status: She is alert and oriented to person, place, and time. Mental status is at baseline.     Cranial Nerves: No cranial nerve deficit.     Motor: No weakness.     Coordination: Coordination normal.     Gait: Gait normal.  Psychiatric:        Mood and Affect: Mood normal.        Behavior: Behavior normal.      UC Treatments / Results  Labs (all labs ordered are listed, but only abnormal results are displayed) Labs Reviewed - No data to display  EKG   Radiology DG Lumbar Spine Complete Result Date: 03/21/2024 CLINICAL DATA:  Acute bilateral low back pain without sciatica. Low back pain for 1 week. No known injury. EXAM: LUMBAR SPINE - COMPLETE 4+ VIEW COMPARISON:  None Available. FINDINGS: Five non-rib-bearing lumbar vertebra. Trace anterolisthesis of L4 on L5. Normal vertebral body heights. No fracture or compression deformity. No pars defects or evidence of focal bone abnormality. The disc  spaces are preserved. Sacroiliac joints are congruent. Postsurgical change in the upper abdomen IMPRESSION: Trace anterolisthesis of L4 on L5. Otherwise unremarkable radiographs of the lumbar spine. Electronically Signed   By: Andrea Gasman M.D.   On: 03/21/2024 19:33    Procedures ED EKG  Date/Time: 03/21/2024 6:51 PM  Performed by: Arvis Jolan NOVAK, PA-C Authorized by: Arvis Jolan NOVAK, PA-C   Previous ECG:    Previous ECG:  Unavailable Interpretation:    Interpretation: abnormal   Rate:    ECG rate:  51   ECG rate assessment: bradycardic   Rhythm:    Rhythm: sinus rhythm   Ectopy:    Ectopy: none   QRS:    QRS axis:  Normal   QRS intervals:  Normal   QRS conduction: normal   ST segments:    ST segments:  Normal T waves:    T waves: normal   Comments:     Sinus bradycardia. No ST-T wave abnormalities.  (including critical care time)  Medications Ordered in UC Medications  ketorolac  (TORADOL ) injection 30 mg (30 mg Intramuscular Given 03/21/24 1925)    Initial Impression / Assessment and Plan / UC Course  I have reviewed the triage vital signs and the nursing notes.  Pertinent labs & imaging results that were available during my care of the patient were reviewed by me and considered in my medical decision making (see chart for details).   54 y/o female presents for lower back pain/stiffness/spasms x 1 week. History of cervical DDD and facet arthropathy, hypertension. Reporting neck is stiff and painful but nowhere near as painful as her back.  Taking Tylenol  without relief.  No numbness, weakness, tingling, loss of bowel or bladder control.  Follow-up appointment scheduled with neurosurgical spine center on 04/11/2024.  BP 220/110 and HR 48 bpm. Re-check 160/96 and HR 51 bpm. Patient says initial BP was  checked with her sweater on. After taking the sweater off the BP came down significantly.   Normal neuroexam.  Chest clear.  Heart regular rhythm but bradycardic.  Patient  has tenderness of neck and low back.  Reduced range of motion of neck and back.  Full strength of upper and lower extremities.  Will obtain L-spine x-ray.  Patient given 30 mg IM ketorolac  in clinic for acute back pain relief.  X-ray shows trace anterolisthesis.  Reviewed results with patient.  Printed a copy for her records.  Low back pain.  Prescribed tizanidine .  Advised to continue Tylenol , lidocaine  patches, heat, ice and keep follow-up appointment with spine center.  Advised going to ED if acute worsening of back pain, leg weakness, loss of bowel or bladder control.  Elevated blood pressure.  I refilled her amlodipine  and encouraged her to follow-up with her primary care provider for an appointment as she missed her appointment today.  Discussed going to ED if any severe headaches, vision changes, numbness/tingling or weakness of extremities, chest pain, palpitations, shortness of breath.   Final Clinical Impressions(s) / UC Diagnoses   Final diagnoses:  Acute bilateral low back pain without sciatica  Neck stiffness  Degenerative disc disease, cervical  Essential hypertension  Bradycardia     Discharge Instructions      -X-ray shows one of your back bones is slid forward a little on another back bone. It is very mild and may not even be a cause for your pain--more like an incidental finding. As discussed you may need an MRI if pain last more than a few more weeks or you have numbness or weakness.  BACK PAIN: Stressed avoiding painful activities . RICE (REST, ICE, COMPRESSION, ELEVATION) guidelines reviewed. May alternate ice and heat. Consider use of muscle rubs, Salonpas patches, etc. Use medications as directed including muscle relaxers if prescribed. Take Tylenol  as needed for pain. May also use lidocaine  patches.  F/u with spine specialist.  BACK PAIN RED FLAGS: If the back pain acutely worsens or there are any red flag symptoms such as numbness/tingling, leg weakness, saddle  anesthesia, or loss of bowel/bladder control, go immediately to the ER. Follow up with us  as scheduled or sooner if the pain does not begin to resolve or if it worsens before the follow up    -Elevated blood pressure.  I refilled your amlodipine . Make follow-up with your primary care provider since you missed  today's appointment and will need refills of amlopdine. -Discussed going to ED if any severe headaches, vision changes, numbness/tingling or weakness of extremities, chest pain, palpitations, shortness of breath.     ED Prescriptions     Medication Sig Dispense Auth. Provider   tiZANidine  (ZANAFLEX ) 4 MG tablet Take 1 tablet (4 mg total) by mouth every 8 (eight) hours as needed for up to 7 days. 20 tablet Arvis Huxley B, PA-C   lidocaine  (LIDODERM ) 5 % Place 1 patch onto the skin daily. Remove & Discard patch within 12 hours or as directed by MD 30 patch Arvis Huxley B, PA-C   amLODipine  (NORVASC ) 5 MG tablet Take 1 tablet (5 mg total) by mouth daily. 30 tablet Arvis Huxley NOVAK, PA-C      I have reviewed the PDMP during this encounter.     [1]  Social History Tobacco Use   Smoking status: Never   Smokeless tobacco: Never  Vaping Use   Vaping status: Never Used  Substance Use Topics   Alcohol use: Yes  Comment: rarely   Drug use: Never     Arvis Jolan KATHEE DEVONNA 03/21/24 1943  "
